# Patient Record
Sex: Female | Born: 1975 | Race: White | Hispanic: No | Marital: Married | Smoking: Never smoker
Health system: Southern US, Community
[De-identification: ages and names within clinical notes are randomized; demographics above are authoritative.]

## PROBLEM LIST (undated history)

## (undated) DIAGNOSIS — K8020 Calculus of gallbladder without cholecystitis without obstruction: Secondary | ICD-10-CM

## (undated) DIAGNOSIS — M1990 Unspecified osteoarthritis, unspecified site: Secondary | ICD-10-CM

## (undated) DIAGNOSIS — T7840XA Allergy, unspecified, initial encounter: Secondary | ICD-10-CM

## (undated) HISTORY — DX: Unspecified osteoarthritis, unspecified site: M19.90

## (undated) HISTORY — DX: Calculus of gallbladder without cholecystitis without obstruction: K80.20

## (undated) HISTORY — DX: Allergy, unspecified, initial encounter: T78.40XA

---

## 2011-12-28 ENCOUNTER — Ambulatory Visit: Payer: Self-pay | Admitting: Obstetrics and Gynecology

## 2011-12-28 LAB — CBC WITH DIFFERENTIAL/PLATELET
Basophil #: 0 10*3/uL (ref 0.0–0.1)
Eosinophil #: 0.1 10*3/uL (ref 0.0–0.7)
Eosinophil %: 0.5 %
HCT: 33.8 % — ABNORMAL LOW (ref 35.0–47.0)
Lymphocyte %: 17.4 %
MCHC: 34 g/dL (ref 32.0–36.0)
Monocyte %: 3.2 %
Platelet: 182 10*3/uL (ref 150–440)
RBC: 4.1 10*6/uL (ref 3.80–5.20)
RDW: 15.3 % — ABNORMAL HIGH (ref 11.5–14.5)
WBC: 11.7 10*3/uL — ABNORMAL HIGH (ref 3.6–11.0)

## 2011-12-29 ENCOUNTER — Inpatient Hospital Stay: Payer: Self-pay | Admitting: Obstetrics & Gynecology

## 2011-12-30 LAB — HEMATOCRIT: HCT: 28.4 % — ABNORMAL LOW (ref 35.0–47.0)

## 2012-03-30 LAB — HM PAP SMEAR: HM Pap smear: NORMAL

## 2012-05-09 DIAGNOSIS — K8020 Calculus of gallbladder without cholecystitis without obstruction: Secondary | ICD-10-CM

## 2012-05-09 HISTORY — DX: Calculus of gallbladder without cholecystitis without obstruction: K80.20

## 2012-06-01 ENCOUNTER — Observation Stay: Payer: Self-pay | Admitting: Surgery

## 2012-06-01 LAB — URINALYSIS, COMPLETE
Bilirubin,UR: NEGATIVE
Blood: NEGATIVE
Glucose,UR: NEGATIVE mg/dL (ref 0–75)
Leukocyte Esterase: NEGATIVE
Nitrite: NEGATIVE
Ph: 6 (ref 4.5–8.0)
RBC,UR: <1 /HPF (ref 0–5)
Specific Gravity: 1.011 (ref 1.003–1.030)
Squamous Epithelial: <1
WBC UR: 1 /HPF (ref 0–5)

## 2012-06-01 LAB — COMPREHENSIVE METABOLIC PANEL
Alkaline Phosphatase: 171 U/L — ABNORMAL HIGH (ref 50–136)
Chloride: 106 mmol/L (ref 98–107)
Co2: 27 mmol/L (ref 21–32)
Creatinine: 0.52 mg/dL — ABNORMAL LOW (ref 0.60–1.30)
EGFR (African American): >60
Glucose: 85 mg/dL (ref 65–99)
Osmolality: 278 (ref 275–301)
SGOT(AST): 34 U/L (ref 15–37)
SGPT (ALT): 48 U/L (ref 12–78)

## 2012-06-01 LAB — CBC
HCT: 40.1 % (ref 35.0–47.0)
MCH: 26.2 pg (ref 26.0–34.0)
MCHC: 32.9 g/dL (ref 32.0–36.0)
MCV: 80 fL (ref 80–100)
Platelet: 214 10*3/uL (ref 150–440)
RBC: 5.02 10*6/uL (ref 3.80–5.20)

## 2012-06-01 LAB — PREGNANCY, URINE: Pregnancy Test, Urine: NEGATIVE m[IU]/mL

## 2012-06-02 LAB — CBC WITH DIFFERENTIAL/PLATELET
Basophil #: 0.1 10*3/uL (ref 0.0–0.1)
Eosinophil %: 0.6 %
HCT: 34.8 % — ABNORMAL LOW (ref 35.0–47.0)
HGB: 11.7 g/dL — ABNORMAL LOW (ref 12.0–16.0)
Lymphocyte #: 2.6 10*3/uL (ref 1.0–3.6)
Lymphocyte %: 29.5 %
MCHC: 33.5 g/dL (ref 32.0–36.0)
MCV: 79 fL — ABNORMAL LOW (ref 80–100)
Neutrophil #: 5.5 10*3/uL (ref 1.4–6.5)
Neutrophil %: 62.4 %
WBC: 8.9 10*3/uL (ref 3.6–11.0)

## 2012-08-16 ENCOUNTER — Ambulatory Visit: Payer: Self-pay | Admitting: Gastroenterology

## 2012-08-28 LAB — HM COLONOSCOPY: HM Colonoscopy: NEGATIVE

## 2012-09-05 ENCOUNTER — Ambulatory Visit: Payer: Self-pay | Admitting: Gastroenterology

## 2012-09-07 LAB — PATHOLOGY REPORT

## 2012-09-13 ENCOUNTER — Other Ambulatory Visit: Payer: Self-pay | Admitting: Family Medicine

## 2012-09-13 LAB — IRON AND TIBC
Iron Bind.Cap.(Total): 385 ug/dL (ref 250–450)
Iron Saturation: 8 %
Iron: 31 ug/dL — ABNORMAL LOW (ref 50–170)
Unbound Iron-Bind.Cap.: 354 ug/dL

## 2012-09-13 LAB — FERRITIN: Ferritin (ARMC): 16 ng/mL

## 2012-09-13 LAB — LIPASE, BLOOD: Lipase: 127 U/L (ref 73–393)

## 2012-10-10 ENCOUNTER — Ambulatory Visit: Payer: Self-pay | Admitting: Gastroenterology

## 2012-10-11 LAB — PATHOLOGY REPORT

## 2012-11-06 ENCOUNTER — Encounter: Payer: Self-pay | Admitting: General Surgery

## 2012-11-29 ENCOUNTER — Ambulatory Visit (INDEPENDENT_AMBULATORY_CARE_PROVIDER_SITE_OTHER): Payer: BC Managed Care – PPO | Admitting: General Surgery

## 2012-11-29 ENCOUNTER — Encounter: Payer: Self-pay | Admitting: General Surgery

## 2012-11-29 VITALS — BP 134/82 | HR 78 | Resp 12 | Ht 64.0 in | Wt 169.0 lb

## 2012-11-29 DIAGNOSIS — K8020 Calculus of gallbladder without cholecystitis without obstruction: Secondary | ICD-10-CM | POA: Insufficient documentation

## 2012-11-29 NOTE — Patient Instructions (Addendum)

## 2012-11-29 NOTE — Progress Notes (Signed)
Patient ID: Stacy Alexander, female   DOB: 1975-12-12, 37 y.o.   MRN: [id number]  Chief Complaint  Patient presents with  . Abdominal Pain    evaluation of gallbladder    HPI Stacy Alexander is a 37 y.o. female who presents for an evaluation of her gallbladder. She states she has abdominal pain associated with gallstones. Her first attack was 4 years ago after having her son and hasn't had any more problems until January 2014. She states she has had problems off and on since then. She complains of loose stools as well as constipation but denies any nausea or vomiting. She has had recent lab work that was abnormal and an abdominal ultrasound done in April 2014. January 2014 she was hospitalized for severe abdominal pain. She has had started a gluten free diet within the last 2-3 months. This has had some relief but is still experiencing small attacks.   The patient came in with a longest his questions generated from her initial consultation with [name], M.D. As well as an extensive literature review of the Internet.  At least half the visit was spent reviewing indications for cholecystectomy, options for stone dissolution, possibility that cholecystectomy could worsen her change in bowel habits.   The patient reports until January of this year she had a very sizable, formed stool which gave her great pride on a daily basis. Since her January 2014 hospitalization her stools and more irregular. She has undergone evaluation with the GI service.   The patient seems to think she feels better on a gluten-free diet. This is certainly not confirmatory evidence that she suffers from celiac disease. HPI  Past Medical History  Diagnosis Date  . Gallstones 2014    Past Surgical History  Procedure Laterality Date  . Cesarean section  2010,2013    Family History  Problem Relation Age of Onset  . Cancer Maternal Uncle     colon    Social History History  Substance Use Topics  . Smoking  status: Never Smoker   . Smokeless tobacco: Never Used  . Alcohol Use: Yes    No Known Allergies  Current Outpatient Prescriptions  Medication Sig Dispense Refill  . b complex vitamins tablet Take 1 tablet by mouth daily.      . Iron-Vit C-Vit B12-Folic Acid (IRON 100 PLUS PO) Take by mouth.      . Melaton-Thean-Cham-PassF-LBalm (MELATONIN + L-THEANINE PO) Take by mouth.      . Vitamin D, Ergocalciferol, (DRISDOL) 50000 UNITS CAPS Take 50,000 Units by mouth.       No current facility-administered medications for this visit.    Review of Systems Review of Systems  Constitutional: Negative.   Respiratory: Negative.   Cardiovascular: Negative.   Gastrointestinal: Positive for abdominal pain, diarrhea and constipation.    Blood pressure 134/82, pulse 78, resp. rate 12, height 5\' 4"  (1.626 m), weight 169 lb (76.658 kg), last menstrual period 11/12/2012.  Physical Exam Physical Exam  Constitutional: She is oriented to person, place, and time. She appears well-developed and well-nourished.  Neck: No thyromegaly present.  Cardiovascular: Normal rate, regular rhythm and normal heart sounds.   No murmur heard. Pulmonary/Chest: Effort normal and breath sounds normal.  Abdominal: Soft. Normal appearance and bowel sounds are normal. There is no hepatosplenomegaly. There is no tenderness.  Lymphadenopathy:    She has no cervical adenopathy.  Neurological: She is alert and oriented to person, place, and time.  Skin: Skin is warm and dry.  Data Reviewed Records of her evaluation with Dr. Katrinka Blazing, Owens Shark, NP, colonoscopy and upper endoscopy reports were reviewed.  Ultrasound examination dated August 16, 2012 showed gallstones.  Colon biopsies completed September 05, 2012 showed colonic mucosa with focal lymphoid aggregates. No evidence of microscopic colitis. Small intestine biopsy completed at the time of her October 10, 2012 upper endoscopy was negative for super room, intraepithelial  leukocytosis or dysplasia. Normal villous architecture.Screening testing for celiac disease was negative.  Pancreatic function was normal. Elevated B12 level. No serologic markers for celiac disease. CBC encumbrance of metabolic panel dated August 10, 2012 was notable for a minimally depressed MCV at 79. Trace elevation of the alkaline phosphatase at 119.  Assessment    Chronic cholecystitis and cholelithiasis. No biochemical or pathologic evidence of celiac disease     Plan    The patient is very committed to the possibility of cholecystectomy in spite of a strong family history and episodic attacks that at times have been quite impressive. She notified the office of how we can be of assistance.     Patient to call the office when ready to arrange gallbladder surgery.   [name] 11/30/2012, 1:46 PM

## 2012-11-30 ENCOUNTER — Encounter: Payer: Self-pay | Admitting: General Surgery

## 2013-04-14 IMAGING — CT CT ABD-PELV W/ CM
1 of 2 series · 15 of 32 positions shown, 19 images · non-contrast
Comparison: none

REASON FOR EXAM: (1) persistent abd pain; (2) same.
COMMENTS:

[Series 2: 3mm soft tissue · axial · 0.74mm/px · z∈[-416,-24]mm · 15 of 143 slices shown, 19 images]
[im 6/143  soft-tissue]
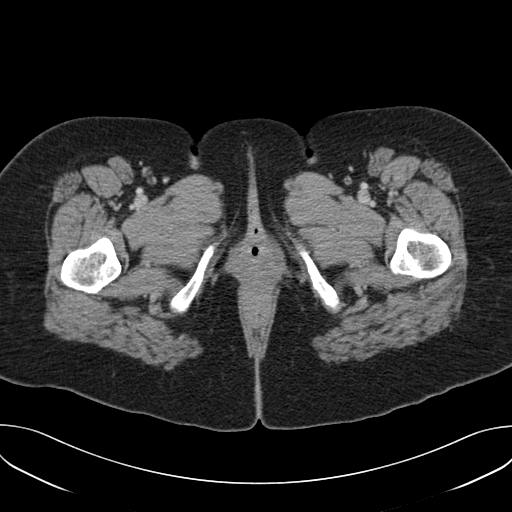
[im 6/143  bone]
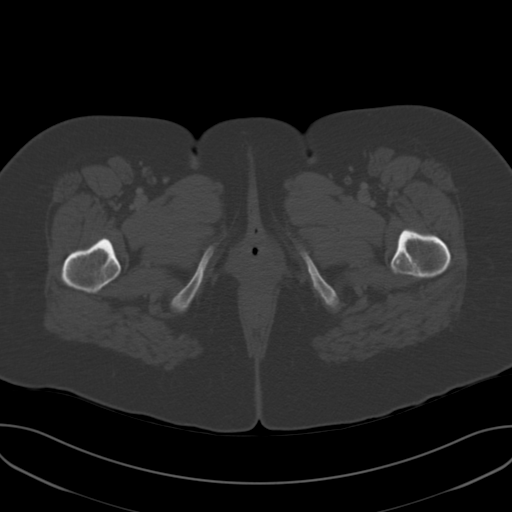
[im 18/143  soft-tissue]
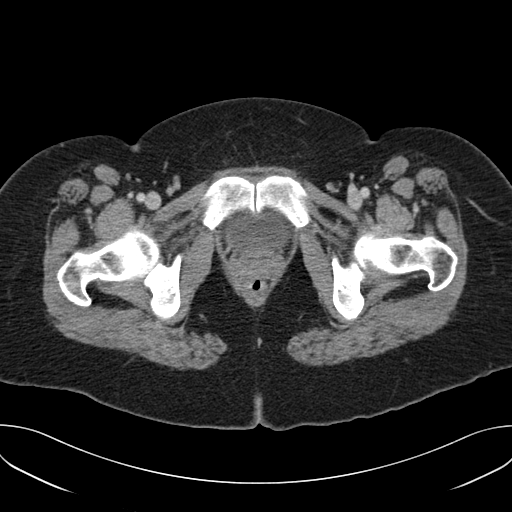
[im 29/143  soft-tissue]
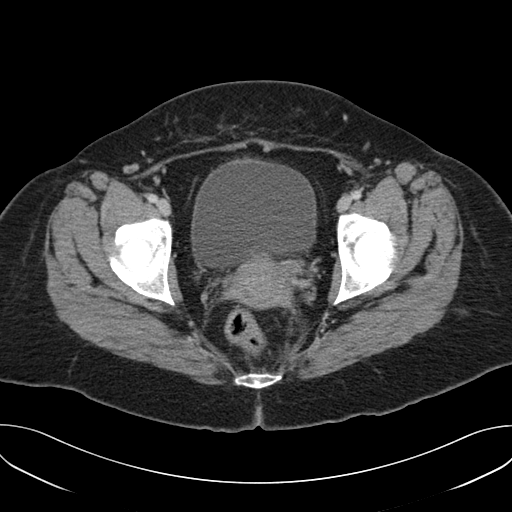
[im 40/143  soft-tissue]
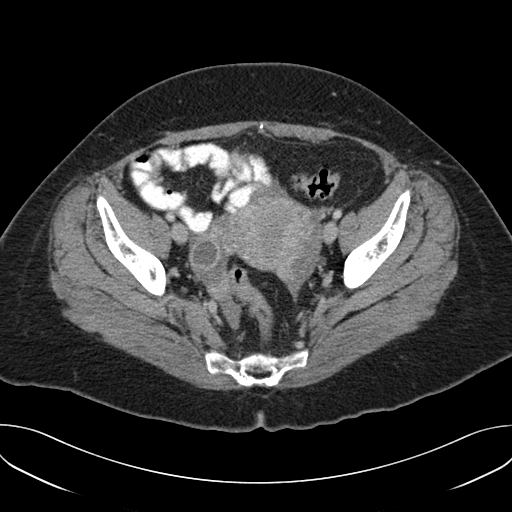
[im 52/143  soft-tissue]
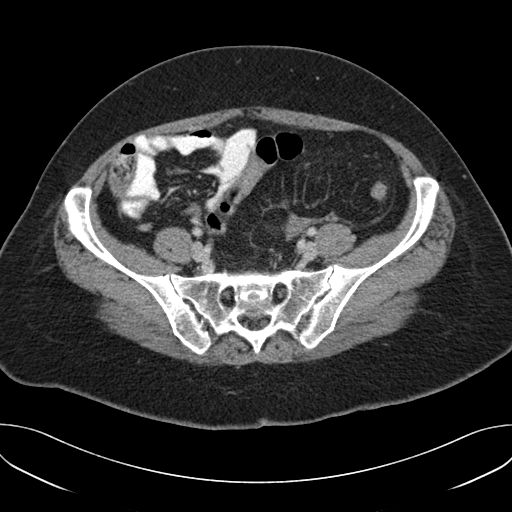
[im 63/143  soft-tissue]
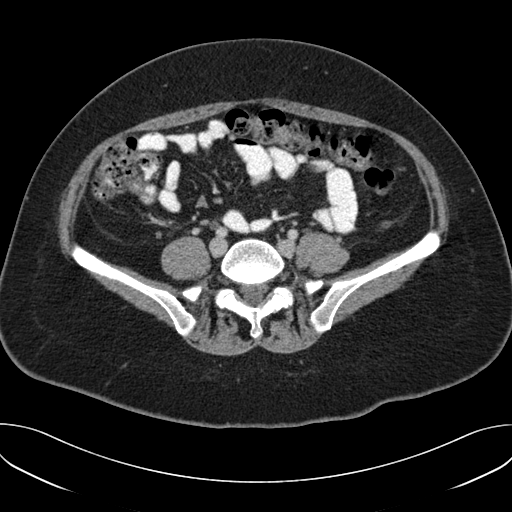
[im 74/143  soft-tissue]
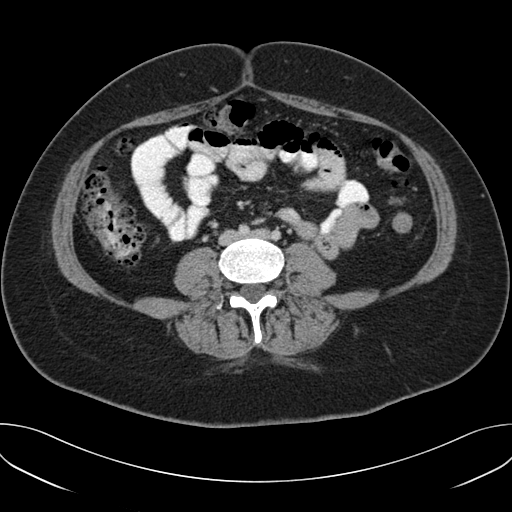
[im 80/143  soft-tissue]
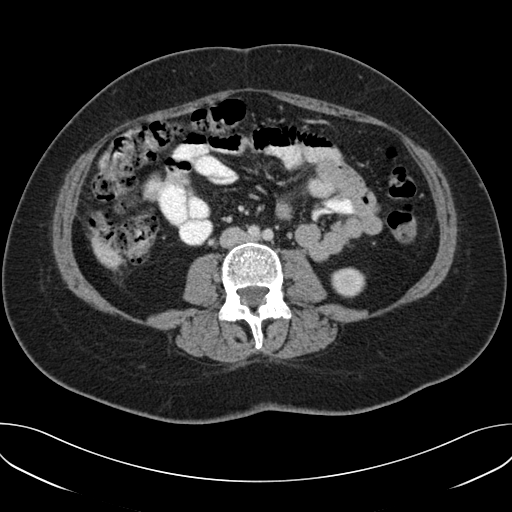
[im 91/143  soft-tissue]
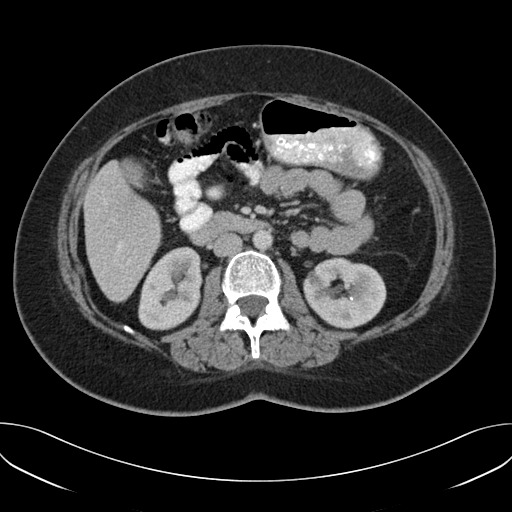
[im 91/143  bone]
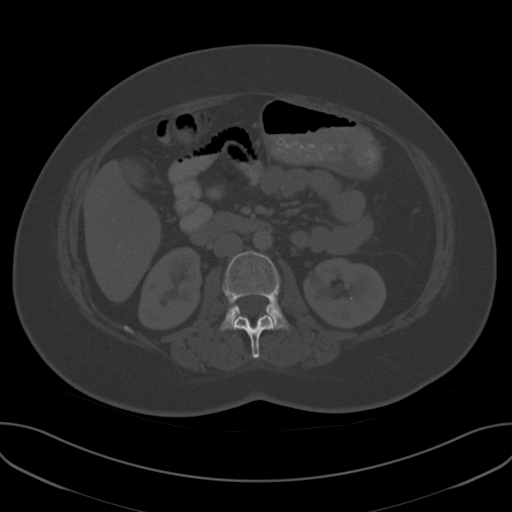
[im 103/143  soft-tissue]
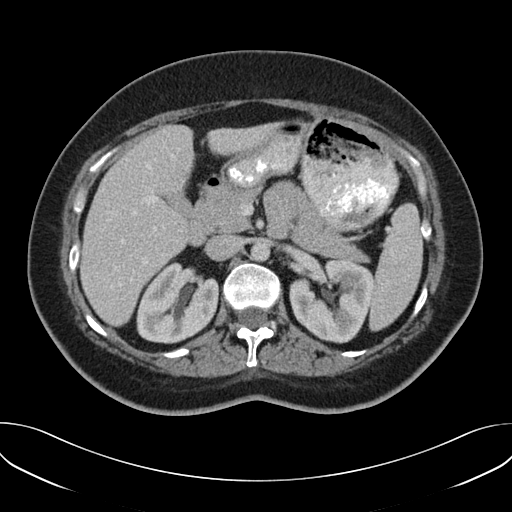
[im 114/143  soft-tissue]
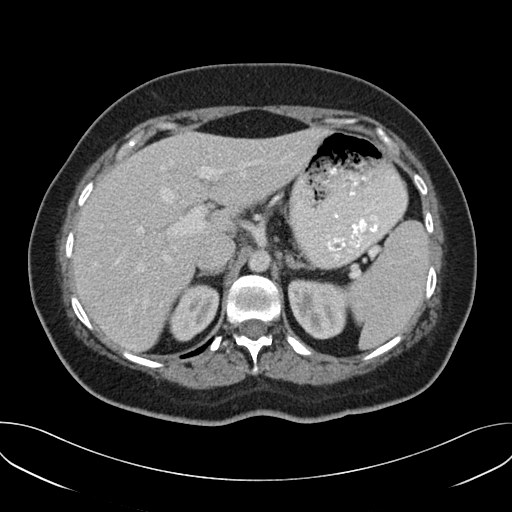
[im 120/143  lung]
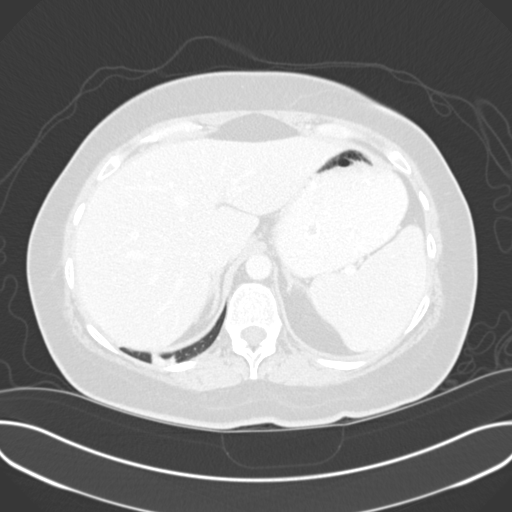
[im 125/143  soft-tissue]
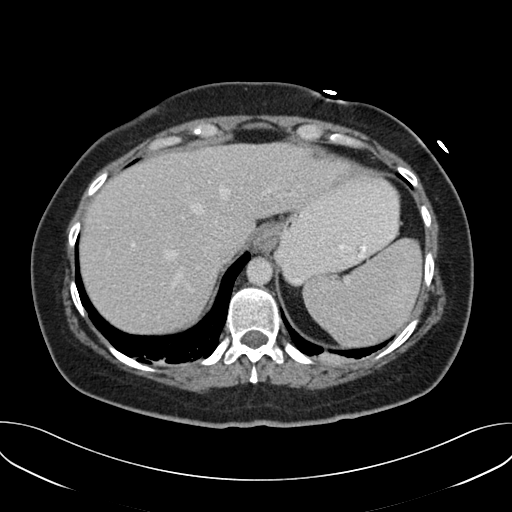
[im 125/143  lung]
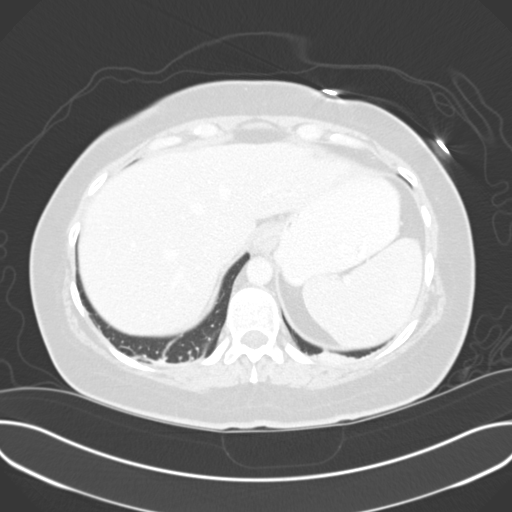
[im 131/143  lung]
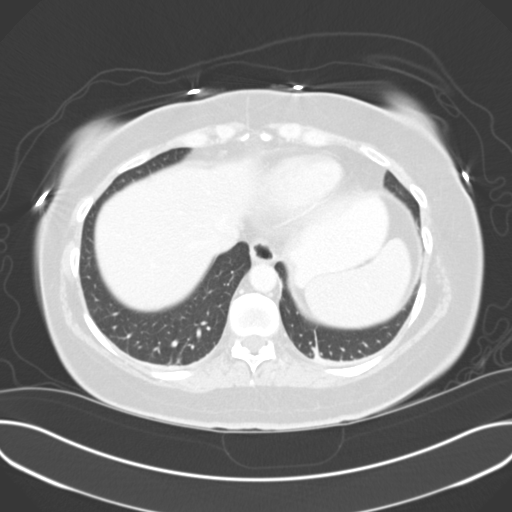
[im 137/143  soft-tissue]
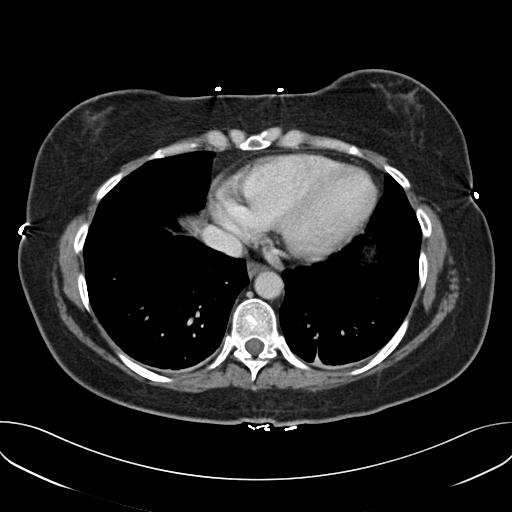
[im 137/143  lung]
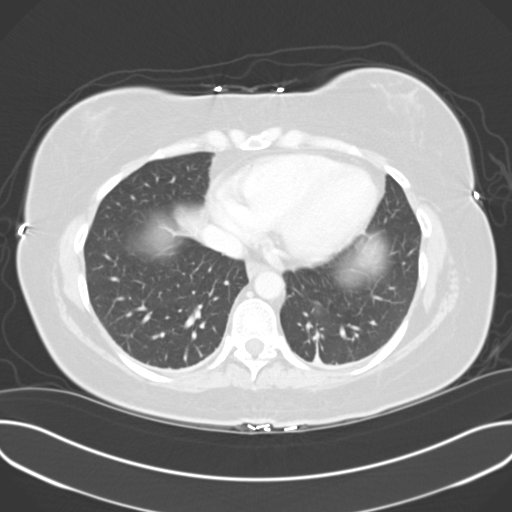

[15 of 32 positions shown; findings below may reference images not displayed]

PROCEDURE:     CT  - CT ABDOMEN / PELVIS  W  - June 02, 2012 [DATE]

RESULT:     CT of the abdomen and pelvis is performed utilizing 100 mL of
Isovue 300 iodinated intravenous contrast with images reconstructed at 3 mm
slice thickness compared to previous exam dated 01 June, 2012.

A there is left nephrolithiasis present. There is a cyst in the right ovary
with peripheral enhancement measuring approximately 2.24 cm diameter similar
to that seen previously. There is free fluid in the pelvis. The urinary
bladder is unremarkable. The uterus appears to be unremarkable. No left
adnexal abnormality is seen. The appendix is seen arising from the cecum in
the area of image 87 along the posterior surface than coursing medially with
the tip appearing to be in the region of image 92. The tip is not well
defined. The margins of the base are not well defined on image 87. There is
some minimal stranding there does not appear to be flagrant appendicitis.
Mild inflammation would be difficult to completely exclude. There is no free
air.

The liver, spleen, adrenal glands, pancreas and gallbladder appear
unremarkable. Neither kidney shows obstruction. There is no hydroureter. The
bony structures appear unremarkable.
IMPRESSION: 1. Cyst or follicle in the right ovary. Small amount of free fluid in the
pelvis which appears essentially unchanged. Left nephrolithiasis is present.
2. The possibility of some very minimal periappendiceal stranding is not
excluded. Correlate clinically. The minimal changes the tip of the appendix
and near the base could be secondary to reaction from another process given
the changes in the pelvis.

[REDACTED]

## 2013-06-28 IMAGING — US ABDOMEN ULTRASOUND
1 series · 14 of 25 positions shown · non-contrast
Comparison: none

REASON FOR EXAM: Abd pain RUQ LLQ nausea vomiting
COMMENTS:

PROCEDURE:     US  - US ABDOMEN GENERAL SURVEY  - August 16, 2012  [DATE]
RESULT:     History: Pain.
Comparison study: CT abdomen 06/02/2012.

[Series 1: abdomen ultrasound · 0.26mm/px · 14 of 96 slices shown]
[im 1/96]
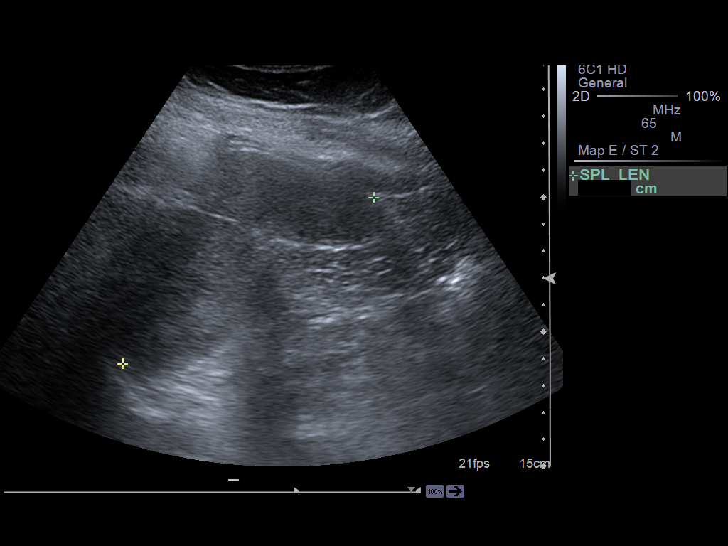
[im 8/96]
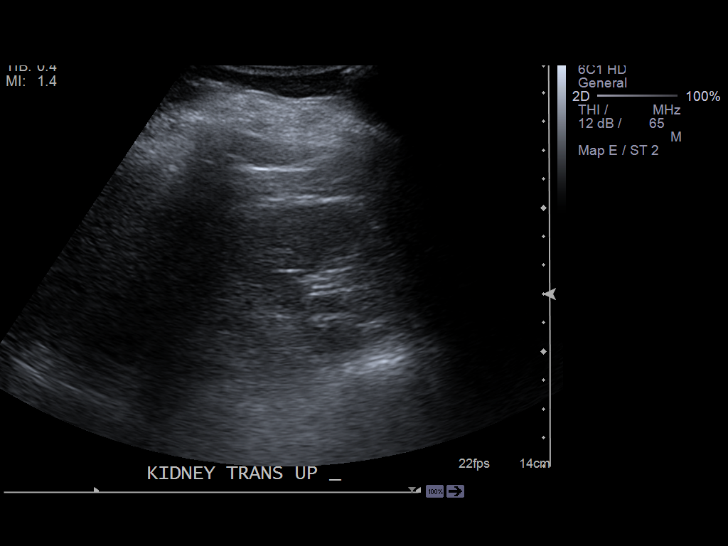
[im 16/96]
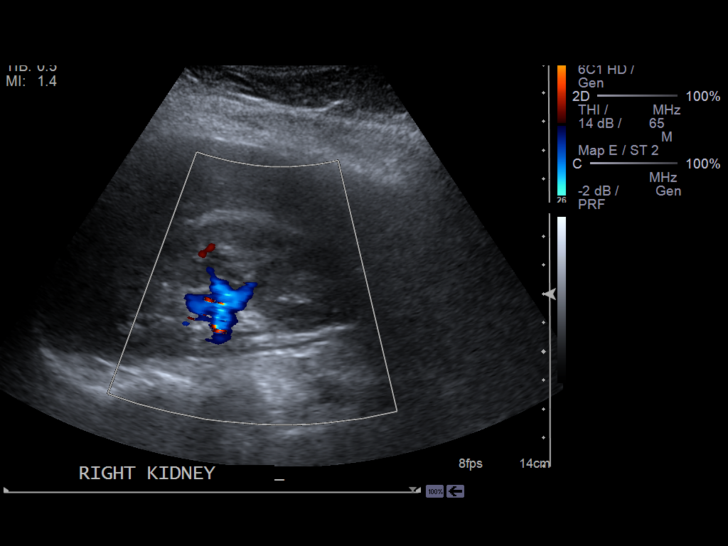
[im 24/96]
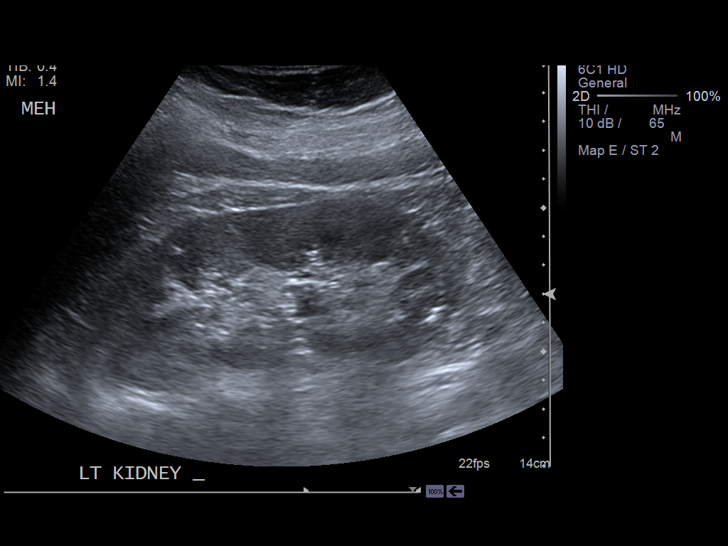
[im 32/96]
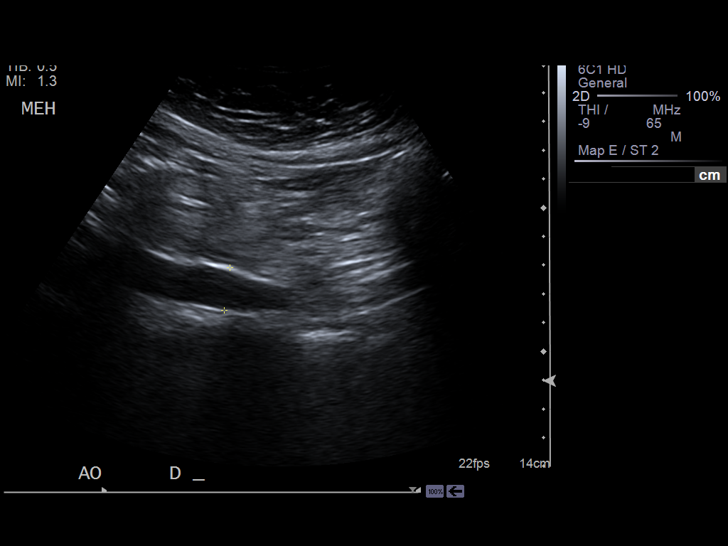
[im 36/96]
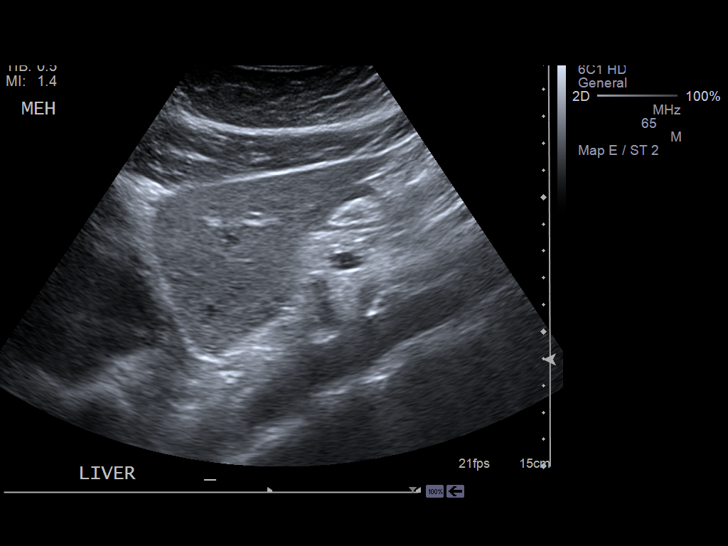
[im 44/96]
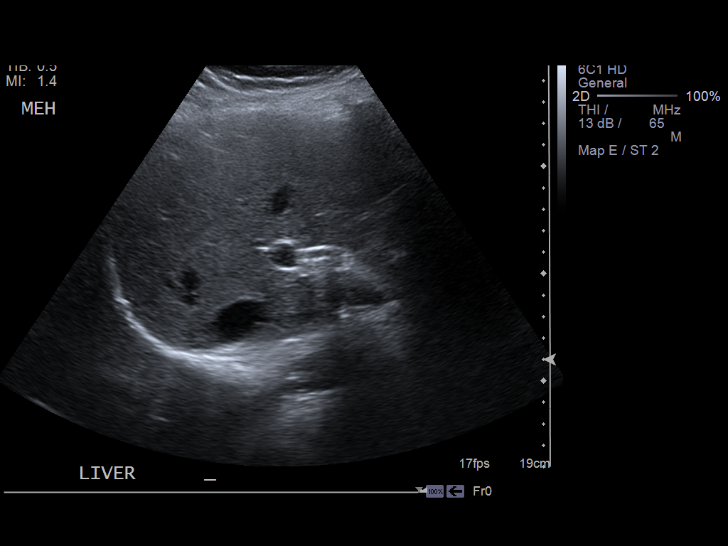
[im 52/96]
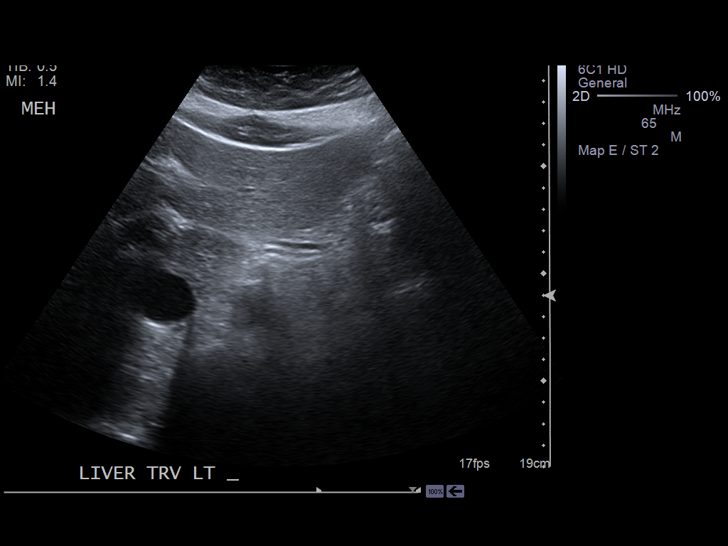
[im 60/96]
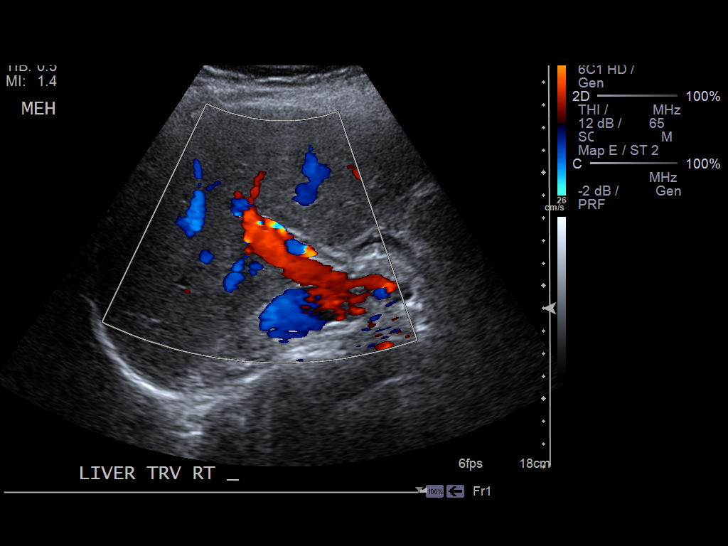
[im 64/96]
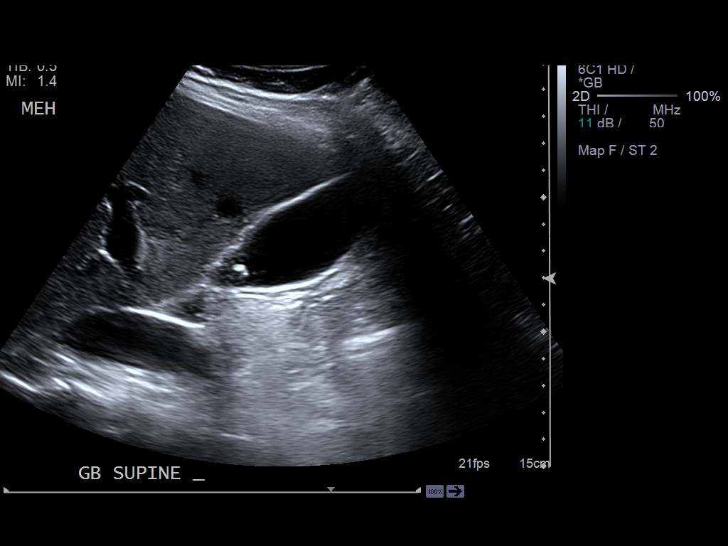
[im 72/96]
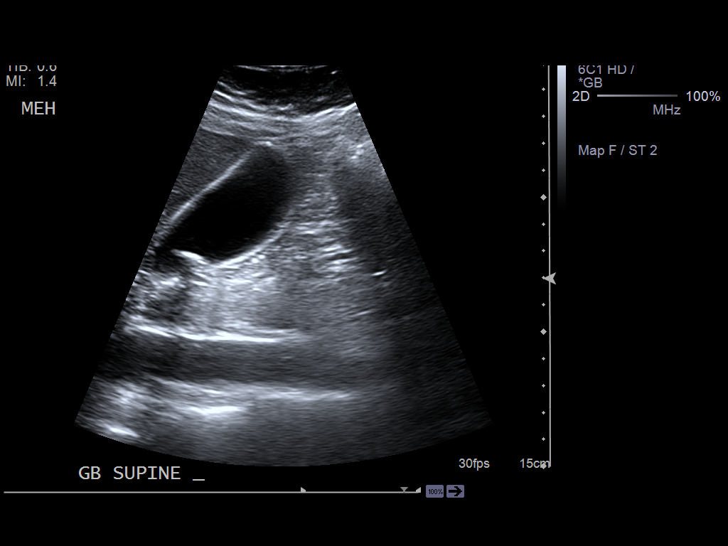
[im 80/96]
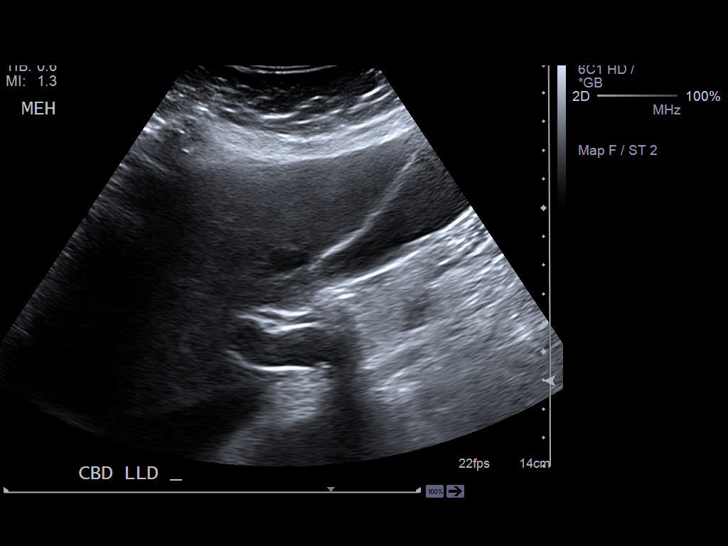
[im 88/96]
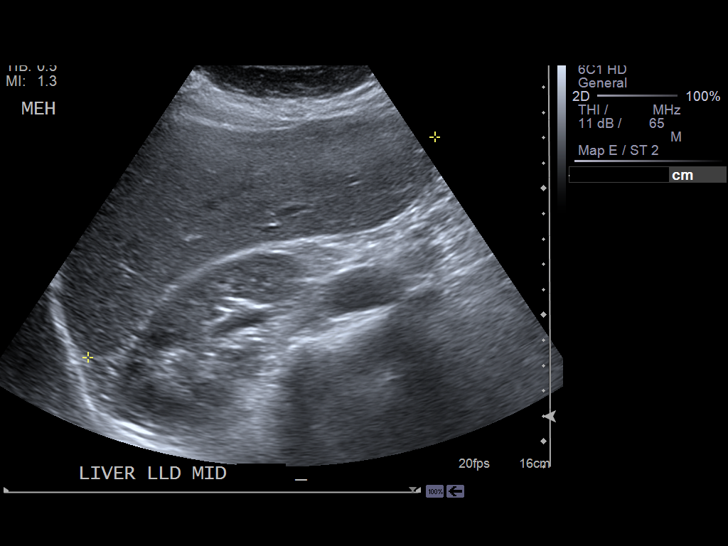
[im 96/96]
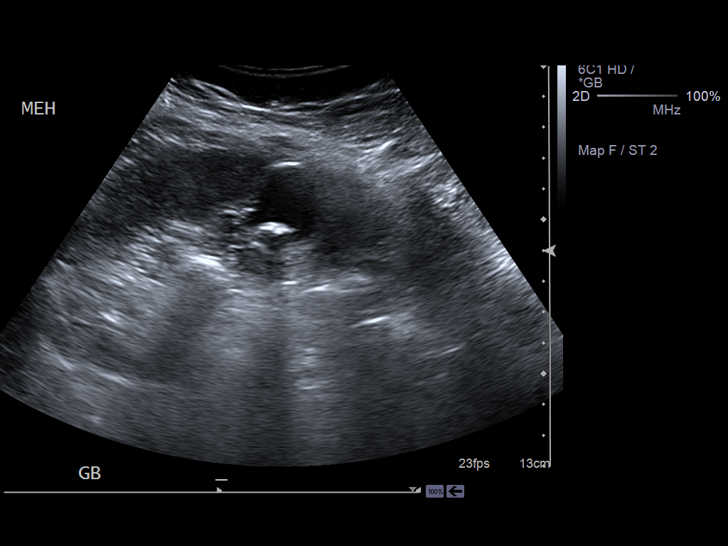

[14 of 25 positions shown; findings below may reference images not displayed]

FINDINGS: Liver normal. Portal vein patent. Pancreas normal. Gallstones.
Gall bladder wall thickness 1.8 mm. Common bile duct caliber 3.2 mm.
Abdominal aorta normal caliber. No hydronephrosis.
IMPRESSION: Gallstones.

## 2014-03-10 ENCOUNTER — Encounter: Payer: Self-pay | Admitting: General Surgery

## 2014-03-28 ENCOUNTER — Encounter: Payer: Self-pay | Admitting: Internal Medicine

## 2014-03-28 ENCOUNTER — Ambulatory Visit (INDEPENDENT_AMBULATORY_CARE_PROVIDER_SITE_OTHER): Payer: BC Managed Care – PPO | Admitting: Internal Medicine

## 2014-03-28 VITALS — BP 120/78 | HR 81 | Temp 98.0°F | Resp 16 | Ht 64.5 in | Wt 155.5 lb

## 2014-03-28 DIAGNOSIS — R1084 Generalized abdominal pain: Secondary | ICD-10-CM

## 2014-03-28 DIAGNOSIS — R197 Diarrhea, unspecified: Secondary | ICD-10-CM | POA: Insufficient documentation

## 2014-03-28 DIAGNOSIS — N924 Excessive bleeding in the premenopausal period: Secondary | ICD-10-CM

## 2014-03-28 DIAGNOSIS — E785 Hyperlipidemia, unspecified: Secondary | ICD-10-CM

## 2014-03-28 DIAGNOSIS — F4323 Adjustment disorder with mixed anxiety and depressed mood: Secondary | ICD-10-CM | POA: Insufficient documentation

## 2014-03-28 DIAGNOSIS — G629 Polyneuropathy, unspecified: Secondary | ICD-10-CM | POA: Insufficient documentation

## 2014-03-28 DIAGNOSIS — R002 Palpitations: Secondary | ICD-10-CM

## 2014-03-28 LAB — COMPREHENSIVE METABOLIC PANEL
ALK PHOS: 88 U/L (ref 39–117)
ALT: 27 U/L (ref 0–35)
AST: 32 U/L (ref 0–37)
Albumin: 4.4 g/dL (ref 3.5–5.2)
BUN: 11 mg/dL (ref 6–23)
CO2: 26 mEq/L (ref 19–32)
CREATININE: 0.5 mg/dL (ref 0.4–1.2)
Calcium: 9.2 mg/dL (ref 8.4–10.5)
Chloride: 106 mEq/L (ref 96–112)
GFR: 135.48 mL/min (ref >60.00)
Glucose, Bld: 82 mg/dL (ref 70–99)
Potassium: 3.7 mEq/L (ref 3.5–5.1)
Sodium: 139 mEq/L (ref 135–145)
Total Bilirubin: 0.7 mg/dL (ref 0.2–1.2)
Total Protein: 7.2 g/dL (ref 6.0–8.3)

## 2014-03-28 LAB — TSH: TSH: 0.99 u[IU]/mL (ref 0.35–4.50)

## 2014-03-28 LAB — MAGNESIUM: Magnesium: 1.8 mg/dL (ref 1.5–2.5)

## 2014-03-28 LAB — CBC WITH DIFFERENTIAL/PLATELET
BASOS ABS: 0.1 10*3/uL (ref 0.0–0.1)
Basophils Relative: 0.8 % (ref 0.0–3.0)
EOS ABS: 0.1 10*3/uL (ref 0.0–0.7)
Eosinophils Relative: 1.6 % (ref 0.0–5.0)
HCT: 41 % (ref 36.0–46.0)
HEMOGLOBIN: 13.7 g/dL (ref 12.0–15.0)
LYMPHS PCT: 27.3 % (ref 12.0–46.0)
Lymphs Abs: 2.1 10*3/uL (ref 0.7–4.0)
MCHC: 33.4 g/dL (ref 30.0–36.0)
MCV: 82.1 fl (ref 78.0–100.0)
MONOS PCT: 6.3 % (ref 3.0–12.0)
Monocytes Absolute: 0.5 10*3/uL (ref 0.1–1.0)
NEUTROS PCT: 64 % (ref 43.0–77.0)
Neutro Abs: 4.9 10*3/uL (ref 1.4–7.7)
PLATELETS: 156 10*3/uL (ref 150.0–400.0)
RBC: 4.99 Mil/uL (ref 3.87–5.11)
RDW: 14.2 % (ref 11.5–15.5)
WBC: 7.7 10*3/uL (ref 4.0–10.5)

## 2014-03-28 LAB — LIPID PANEL
CHOLESTEROL: 154 mg/dL (ref 0–200)
HDL: 51 mg/dL (ref >39.00)
LDL CALC: 95 mg/dL (ref 0–99)
NonHDL: 103
TRIGLYCERIDES: 38 mg/dL (ref 0.0–149.0)
Total CHOL/HDL Ratio: 3
VLDL: 7.6 mg/dL (ref 0.0–40.0)

## 2014-03-28 LAB — VITAMIN B12: VITAMIN B 12: 678 pg/mL (ref 211–911)

## 2014-03-28 MED ORDER — SERTRALINE HCL 50 MG PO TABS: 50.0000 mg | ORAL_TABLET | Freq: Every day | ORAL | Status: DC

## 2014-03-28 NOTE — Progress Notes (Signed)
Patient ID: Stacy MormonFrances R Alexander, female   DOB: 12/01/1975, 38 y.o.   MRN: [id number]  Patient Active Problem List   Diagnosis Date Noted  . Abdominal pain 03/30/2014  . Menorrhagia, premenopausal 03/30/2014  . Adjustment disorder with mixed anxiety and depressed mood 03/28/2014  . Neuropathy 03/28/2014  . Diarrhea 03/28/2014  . Palpitations 03/28/2014  . Gallstones     Subjective:  CC:   Chief Complaint  Patient presents with  . Establish Care    HPI:   Stacy PomfretFrances R Frauneis a 38 y.o. female who presents as a referral  by mother Stacy Alexander.  Patient has a long, list of concerns today .    HM:  requesting  PAP smear  Last one was 2 years ago  No history of abnormals.    Heavy muesds, not tampons,  About 4 or 5 daily.  The first 2 days are heavy,  Told she was borderline anemic,  Tried taking iron supplements last month , stopped because the OTC formulations made the  veins in her legs bulge.  Has  History of rvaricose veins.   Has not had hgb or iron checked in 1 year and tolerated a prescribed iron for 3 months previously without leg symptoms .   History of gallstones found a year ago during workup for change in stool BM to loose stools  .chole advised but deferred.  Had a colonoscopy and EGD were normal. Stacy Potters([facility]) celiac test negative. Has lost 30 lbs avoiding gluten.     History of several broken bones as a child: leg fracture at 18 months, both arms fractured as a child.  elementary school. mishaps`   Now getting bilateral upper quadrant pain .  Stabbing quality not sure if brought on by eating.  Occurs once daily or every other day, lasts about 2 minutes . Does not occur when exercising , does not occur when lying down,  Very random   Bowel movements are  regular,  Has a normal BM in the morming,  Then has a liquid stool  If she has a smoothie made from protein powder (made from hemp seed, pea protein, and banana, blueberries and kale /chard/spinach with keffir made from [place])  .  For past 1.5 years. The onset of her GI  symptoms appear to cooncide with use of smoothies  Exercise: treadmill. Upper body weight s,  abd machine.  Anxiety: 2 young boys,  Stay at home MOm.  Likes to avoid medications .  Gets heart palpitations,  Feels both shoulders get heavy ,  Impacting daily life,  Feels she is is not handling her tasks well and not enjoying children and husband.  Started when youngest son was born and became apneic and nd hypothermic and required  a 7 day stay in then NICU.  This son has subsequently been diagnosed with developmental delay,  Lots of therapy going on .  Patient denies mania , suicidality,  No obsessive compulsive behaviors or thoughts but does report compulsive neatness  As a perfecionies't  Using wine to relax but getting diminishing returns.  Only desires wine during the  week   zoloft recommended  3) weird feeling in toes,  Shooting feeling  .  Occurs once daily ,  Lasts < 5 sec s  4) callous pf right lateral MT.  History of self diagnosed plantar fasciitis    Past Medical History  Diagnosis Date  . Gallstones 2014  . Arthritis   . Allergy     No  Known Allergies    Past Surgical History  Procedure Laterality Date  . Cesarean section  2010,2013    History   Social History  . Marital Status: Married    Spouse Name: N/A    Number of Children: N/A  . Years of Education: N/A   Occupational History  . Not on file.   Social History Main Topics  . Smoking status: Never Smoker   . Smokeless tobacco: Never Used  . Alcohol Use: Yes  . Drug Use: No  . Sexual Activity: Not on file   Other Topics Concern  . Not on file   Social History Narrative   Family History  Problem Relation Age of Onset  . Cancer Maternal Uncle     colon  . Hypertension Mother   . Hyperlipidemia Mother   . Hypertension Father   . Hyperlipidemia Father       Review of Systems:   The rest of the review of systems was negative except those addressed  in the HPI.      Objective:  BP 120/78 mmHg  Pulse 81  Temp(Src) 98 F (36.7 C) (Oral)  Resp 16  Ht 5' 4.5" (1.638 m)  Wt 155 lb 8 oz (70.534 kg)  BMI 26.29 kg/m2  SpO2 99%  LMP 02/28/2014 (Approximate)  General appearance: alert, cooperative and appears stated age Ears: normal TM's and external ear canals both ears Throat: lips, mucosa, and tongue normal; teeth and gums normal Neck: no adenopathy, no carotid bruit, supple, symmetrical, trachea midline and thyroid not enlarged, symmetric, no tenderness/mass/nodules Back: symmetric, no curvature. ROM normal. No CVA tenderness. Lungs: clear to auscultation bilaterally Heart: regular rate and rhythm, S1, S2 normal, no murmur, click, rub or gallop Abdomen: soft, non-tender; bowel sounds normal; no masses,  no organomegaly Pulses: 2+ and symmetric Skin: Skin color, texture, turgor normal. No rashes or lesions Lymph nodes: Cervical, supraclavicular, and axillary nodes normal.  Assessment and Plan:  Adjustment disorder with mixed anxiety and depressed mood zoloft trial recommended  Prn alprazolam   Neuropathy Suggested by history of shooting pains in feet.  Workup in proess for B12 , thyroid deficiency   Palpitations Exam is norma cbc,  B12,thyroid all normal..  Reassurance given.   Lab Results  Component Value Date   WBC 7.7 03/28/2014   HGB 13.7 03/28/2014   HCT 41.0 03/28/2014   MCV 82.1 03/28/2014   PLT 156.0 03/28/2014   Lab Results  Component Value Date   TSH 0.99 03/28/2014   Lab Results  Component Value Date   NA 139 03/28/2014   K 3.7 03/28/2014   CL 106 03/28/2014   CO2 26 03/28/2014     Diarrhea Appears to be food related, since it coincides with use of smoothies daily.  Advised to supsend smoothies for a week to see if episodes resolve.  If not, trial of bentyl for irritable bowel.   Abdominal pain Her recurrent bilateral upper abd pain is suggestive of IBS given her prior negative workup  .   Will consider trial of bentyl   Menorrhagia, premenopausal Difficult to quantify patient's volume of menses,  She is not anemic,  Thyroid function is normal. reassurance provided. Iron studies are pending   A total of 60  minutes was spent with patient more than half of which was spent in counseling patient on the above mentioned issues , reviewing and explaining recent labs and imaging studies done, and coordination of care. Updated Medication List Outpatient Encounter Prescriptions as  of 03/28/2014  Medication Sig  . b complex vitamins tablet Take 1 tablet by mouth daily.  . Iron-Vit C-Vit B12-Folic Acid (IRON 100 PLUS PO) Take by mouth.  . Melaton-Thean-Cham-PassF-LBalm (MELATONIN + L-THEANINE PO) Take by mouth.  . sertraline (ZOLOFT) 50 MG tablet Take 1 tablet (50 mg total) by mouth daily.  . Vitamin D, Ergocalciferol, (DRISDOL) 50000 UNITS CAPS Take 50,000 Units by mouth.     Orders Placed This Encounter  Procedures  . Vitamin B12  . CBC with Differential  . Folate RBC  . TSH  . Comprehensive metabolic panel  . Magnesium  . Lipid panel  . HM PAP SMEAR  . Ferritin  . Iron and TIBC    Return in about 6 weeks (around 05/09/2014).

## 2014-03-28 NOTE — Patient Instructions (Signed)
i AM STARTING YOU ON GENERIC ZOLOFT,  Start with 1/2 tablet daily with dinner,   Increase to full tablet after a few days.    Sertraline tablets What is this medicine? SERTRALINE (SER tra leen) is used to treat depression. It may also be used to treat obsessive compulsive disorder, panic disorder, post-trauma stress, premenstrual dysphoric disorder (PMDD) or social anxiety. This medicine may be used for other purposes; ask your health care provider or pharmacist if you have questions. COMMON BRAND NAME(S): Zoloft What should I tell my health care provider before I take this medicine? They need to know if you have any of these conditions: -bipolar disorder or a family history of bipolar disorder -diabetes -glaucoma -heart disease -high blood pressure -history of irregular heartbeat -history of low levels of calcium, magnesium, or potassium in the blood -if you often drink alcohol -liver disease -receiving electroconvulsive therapy -seizures -suicidal thoughts, plans, or attempt; a previous suicide attempt by you or a family member -thyroid disease -an unusual or allergic reaction to sertraline, other medicines, foods, dyes, or preservatives -pregnant or trying to get pregnant -breast-feeding How should I use this medicine? Take this medicine by mouth with a glass of water. Follow the directions on the prescription label. You can take it with or without food. Take your medicine at regular intervals. Do not take your medicine more often than directed. Do not stop taking this medicine suddenly except upon the advice of your doctor. Stopping this medicine too quickly may cause serious side effects or your condition may worsen. A special MedGuide will be given to you by the pharmacist with each prescription and refill. Be sure to read this information carefully each time. Talk to your pediatrician regarding the use of this medicine in children. While this drug may be prescribed for children  as young as 7 years for selected conditions, precautions do apply. Overdosage: If you think you have taken too much of this medicine contact a poison control center or emergency room at once. NOTE: This medicine is only for you. Do not share this medicine with others. What if I miss a dose? If you miss a dose, take it as soon as you can. If it is almost time for your next dose, take only that dose. Do not take double or extra doses. What may interact with this medicine? Do not take this medicine with any of the following medications: -certain medicines for fungal infections like fluconazole, itraconazole, ketoconazole, posaconazole, voriconazole -cisapride -disulfiram -dofetilide -linezolid -MAOIs like Carbex, Eldepryl, Marplan, Nardil, and Parnate -metronidazole -methylene blue (injected into a vein) -pimozide -thioridazine -ziprasidone This medicine may also interact with the following medications: -alcohol -aspirin and aspirin-like medicines -certain medicines for depression, anxiety, or psychotic disturbances -certain medicines for irregular heart beat like flecainide, propafenone -certain medicines for migraine headaches like almotriptan, eletriptan, frovatriptan, naratriptan, rizatriptan, sumatriptan, zolmitriptan -certain medicines for sleep -certain medicines for seizures like carbamazepine, valproic acid, phenytoin -certain medicines that treat or prevent blood clots like warfarin, enoxaparin, dalteparin -cimetidine -digoxin -diuretics -fentanyl -furazolidone -isoniazid -lithium -NSAIDs, medicines for pain and inflammation, like ibuprofen or naproxen -other medicines that prolong the QT interval (cause an abnormal heart rhythm) -procarbazine -rasagiline -supplements like St. John's wort, kava kava, valerian -tolbutamide -tramadol -tryptophan This list may not describe all possible interactions. Give your health care provider a list of all the medicines, herbs,  non-prescription drugs, or dietary supplements you use. Also tell them if you smoke, drink alcohol, or use illegal drugs. Some items  may interact with your medicine. What should I watch for while using this medicine? Tell your doctor if your symptoms do not get better or if they get worse. Visit your doctor or health care professional for regular checks on your progress. Because it may take several weeks to see the full effects of this medicine, it is important to continue your treatment as prescribed by your doctor. Patients and their families should watch out for new or worsening thoughts of suicide or depression. Also watch out for sudden changes in feelings such as feeling anxious, agitated, panicky, irritable, hostile, aggressive, impulsive, severely restless, overly excited and hyperactive, or not being able to sleep. If this happens, especially at the beginning of treatment or after a change in dose, call your health care professional. Bonita QuinYou may get drowsy or dizzy. Do not drive, use machinery, or do anything that needs mental alertness until you know how this medicine affects you. Do not stand or sit up quickly, especially if you are an older patient. This reduces the risk of dizzy or fainting spells. Alcohol may interfere with the effect of this medicine. Avoid alcoholic drinks. Your mouth may get dry. Chewing sugarless gum or sucking hard candy, and drinking plenty of water may help. Contact your doctor if the problem does not go away or is severe. What side effects may I notice from receiving this medicine? Side effects that you should report to your doctor or health care professional as soon as possible: -allergic reactions like skin rash, itching or hives, swelling of the face, lips, or tongue -black or bloody stools, blood in the urine or vomit -fast, irregular heartbeat -feeling faint or lightheaded, falls -hallucination, loss of contact with reality -seizures -suicidal thoughts or other  mood changes -unusual bleeding or bruising -unusually weak or tired -vomiting Side effects that usually do not require medical attention (report to your doctor or health care professional if they continue or are bothersome): -change in appetite -change in sex drive or performance -diarrhea -increased sweating -indigestion, nausea -tremors This list may not describe all possible side effects. Call your doctor for medical advice about side effects. You may report side effects to FDA at 1-800-FDA-1088. Where should I keep my medicine? Keep out of the reach of children. Store at room temperature between 15 and 30 degrees C (59 and 86 degrees F). Throw away any unused medicine after the expiration date. NOTE: This sheet is a summary. It may not cover all possible information. If you have questions about this medicine, talk to your doctor, pharmacist, or health care provider.  2015, Elsevier/Gold Standard. (2012-11-20 12:57:35)

## 2014-03-30 DIAGNOSIS — N924 Excessive bleeding in the premenopausal period: Secondary | ICD-10-CM | POA: Insufficient documentation

## 2014-03-30 DIAGNOSIS — R109 Unspecified abdominal pain: Secondary | ICD-10-CM | POA: Insufficient documentation

## 2014-03-30 NOTE — Assessment & Plan Note (Signed)
Difficult to quantify patient's volume of menses,  She is not anemic,  Thyroid function is normal. reassurance provided. Iron studies are pending

## 2014-03-30 NOTE — Assessment & Plan Note (Signed)
Exam is norma cbc,  B12,thyroid all normal..  Reassurance given.   Lab Results  Component Value Date   WBC 7.7 03/28/2014   HGB 13.7 03/28/2014   HCT 41.0 03/28/2014   MCV 82.1 03/28/2014   PLT 156.0 03/28/2014   Lab Results  Component Value Date   TSH 0.99 03/28/2014   Lab Results  Component Value Date   NA 139 03/28/2014   K 3.7 03/28/2014   CL 106 03/28/2014   CO2 26 03/28/2014

## 2014-03-30 NOTE — Assessment & Plan Note (Signed)
Appears to be food related, since it coincides with use of smoothies daily.  Advised to supsend smoothies for a week to see if episodes resolve.  If not, trial of bentyl for irritable bowel.

## 2014-03-30 NOTE — Assessment & Plan Note (Signed)
Suggested by history of shooting pains in feet.  Workup in proess for B12 , thyroid deficiency

## 2014-03-30 NOTE — Assessment & Plan Note (Signed)
zoloft trial recommended  Prn alprazolam

## 2014-03-30 NOTE — Assessment & Plan Note (Signed)
Her recurrent bilateral upper abd pain is suggestive of IBS given her prior negative workup  .  Will consider trial of bentyl

## 2014-04-01 ENCOUNTER — Other Ambulatory Visit (INDEPENDENT_AMBULATORY_CARE_PROVIDER_SITE_OTHER): Payer: BC Managed Care – PPO

## 2014-04-01 DIAGNOSIS — N924 Excessive bleeding in the premenopausal period: Secondary | ICD-10-CM

## 2014-04-01 LAB — FERRITIN: FERRITIN: 14.3 ng/mL (ref 10.0–291.0)

## 2014-04-02 LAB — IRON AND TIBC
%SAT: 14 % — AB (ref 20–55)
Iron: 53 ug/dL (ref 42–145)
TIBC: 387 ug/dL (ref 250–470)
UIBC: 334 ug/dL (ref 125–400)

## 2014-04-02 LAB — FOLATE RBC: RBC FOLATE: 851 ng/mL (ref >280)

## 2014-04-24 ENCOUNTER — Ambulatory Visit (INDEPENDENT_AMBULATORY_CARE_PROVIDER_SITE_OTHER): Payer: BC Managed Care – PPO | Admitting: Internal Medicine

## 2014-04-24 ENCOUNTER — Encounter: Payer: Self-pay | Admitting: Internal Medicine

## 2014-04-24 VITALS — BP 112/78 | HR 91 | Temp 98.1°F | Wt 152.0 lb

## 2014-04-24 DIAGNOSIS — B349 Viral infection, unspecified: Secondary | ICD-10-CM

## 2014-04-24 DIAGNOSIS — B9789 Other viral agents as the cause of diseases classified elsewhere: Secondary | ICD-10-CM

## 2014-04-24 DIAGNOSIS — J329 Chronic sinusitis, unspecified: Secondary | ICD-10-CM

## 2014-04-24 MED ORDER — AMOXICILLIN-POT CLAVULANATE 875-125 MG PO TABS: 1.0000 | ORAL_TABLET | Freq: Two times a day (BID) | ORAL | Status: DC

## 2014-04-24 NOTE — Progress Notes (Signed)
HPI  Pt presents to the clinic today with c/o facial pain and pressure, ear fullness, nasal congestion and sore throat. She reports this started 2 days ago. She is blowing clear mucous from her nose. Her cough is productive of yellow mucous. She has had some associated pain with swallowing, chills and body aches. She denies fever. She has tried zyrtec D with some relief. She does have a history of allergies and usually gets a sinus infection twice yearly. She has had sick contacts.  Review of Systems    Past Medical History  Diagnosis Date  . Gallstones 2014  . Arthritis   . Allergy     Family History  Problem Relation Age of Onset  . Cancer Maternal Uncle     colon  . Hypertension Mother   . Hyperlipidemia Mother   . Hypertension Father   . Hyperlipidemia Father     History   Social History  . Marital Status: Married    Spouse Name: N/A    Number of Children: N/A  . Years of Education: N/A   Occupational History  . Not on file.   Social History Main Topics  . Smoking status: Never Smoker   . Smokeless tobacco: Never Used  . Alcohol Use: Yes  . Drug Use: No  . Sexual Activity: Not on file   Other Topics Concern  . Not on file   Social History Narrative    No Known Allergies   Constitutional: Positive headache, fatigue. Denies fever or abrupt weight changes.  HEENT:  Positive facial pain, nasal congestion and sore throat. Denies eye redness, ear pain, ringing in the ears, wax buildup, runny nose or bloody nose. Respiratory: Positive cough. Denies difficulty breathing or shortness of breath.  Cardiovascular: Denies chest pain, chest tightness, palpitations or swelling in the hands or feet.   No other specific complaints in a complete review of systems (except as listed in HPI above).  Objective:  BP 112/78 mmHg  Pulse 91  Temp(Src) 98.1 F (36.7 C) (Oral)  Wt 152 lb (68.947 kg)  SpO2 98%  LMP 02/28/2014 (Approximate)   General: Appears her stated age,  in NAD. HEENT: Head: normal shape and size, maxillary sinus tenderness noted; Eyes: sclera white, no icterus, conjunctiva pink; Ears: Tm's gray and intact, normal light reflex; Nose: mucosa pink and moist, septum midline; Throat/Mouth: + PND. Teeth present, mucosa pink and moist, no exudate noted, no lesions or ulcerations noted.  Neck: No adenopathy noted. Cardiovascular: Normal rate and rhythm. S1,S2 noted.  No murmur, rubs or gallops noted.  Pulmonary/Chest: Normal effort and positive vesicular breath sounds. No respiratory distress. No wheezes, rales or ronchi noted.      Assessment & Plan:   Viral sinusitis  Can use a Neti Pot which can be purchased from your local drug store. Flonase 2 sprays each nostril for 3 days and then as needed. Continue Zyrtec D Will print RX for Augmentin BID for 10 days to start taking early next week if symptoms do not improve  RTC as needed or if symptoms persist.

## 2014-04-24 NOTE — Progress Notes (Signed)
Pre visit review using our clinic review tool, if applicable. No additional management support is needed unless otherwise documented below in the visit note. 

## 2014-04-24 NOTE — Patient Instructions (Signed)

## 2014-05-22 ENCOUNTER — Ambulatory Visit (INDEPENDENT_AMBULATORY_CARE_PROVIDER_SITE_OTHER): Payer: BC Managed Care – PPO | Admitting: Internal Medicine

## 2014-05-22 ENCOUNTER — Encounter: Payer: Self-pay | Admitting: Internal Medicine

## 2014-05-22 VITALS — BP 124/72 | HR 78 | Temp 98.7°F | Resp 16 | Ht 65.0 in | Wt 150.5 lb

## 2014-05-22 DIAGNOSIS — K8010 Calculus of gallbladder with chronic cholecystitis without obstruction: Secondary | ICD-10-CM

## 2014-05-22 DIAGNOSIS — F4323 Adjustment disorder with mixed anxiety and depressed mood: Secondary | ICD-10-CM

## 2014-05-22 DIAGNOSIS — K8020 Calculus of gallbladder without cholecystitis without obstruction: Secondary | ICD-10-CM

## 2014-05-22 DIAGNOSIS — R002 Palpitations: Secondary | ICD-10-CM

## 2014-05-22 MED ORDER — VENLAFAXINE HCL ER 37.5 MG PO CP24: 37.5000 mg | ORAL_CAPSULE | Freq: Every day | ORAL | Status: DC

## 2014-05-22 NOTE — Progress Notes (Signed)
Pre-visit discussion using our clinic review tool. No additional management support is needed unless otherwise documented below in the visit note.  

## 2014-05-22 NOTE — Progress Notes (Signed)
Patient ID: Stacy Alexander, female   DOB: 1976/05/01, 39 y.o.   MRN: [id number]  Patient Active Problem List   Diagnosis Date Noted  . Abdominal pain 03/30/2014  . Menorrhagia, premenopausal 03/30/2014  . Adjustment disorder with mixed anxiety and depressed mood 03/28/2014  . Neuropathy 03/28/2014  . Diarrhea 03/28/2014  . Palpitations 03/28/2014  . Gallstones     Subjective:  CC:   Chief Complaint  Patient presents with  . Follow-up    anxiety issue's    HPI:   Stacy Alexander is a 39 y.o. female who presents for  Follow up on Generalized anxiety and multiple other issues raised at her first office visit a month ago.   She is feeling much better on sertraline 25 mg daily,  But has noticed significant loss of libido and inability to achieve climax. Thhis is upsetting to her and husband and she is requesting a medication change.  She denies dyspareunia, marital strife, and use of marijuana.   She continues to have bilateral upper quadrant pain and has been advised to have her GB removed electively since she has gallstones.       Past Medical History  Diagnosis Date  . Gallstones 2014  . Arthritis   . Allergy     Past Surgical History  Procedure Laterality Date  . Cesarean section  [phone]       The following portions of the patient's history were reviewed and updated as appropriate: Allergies, current medications, and problem list.    Review of Systems:   Patient denies headache, fevers, malaise, unintentional weight loss, skin rash, eye pain, sinus congestion and sinus pain, sore throat, dysphagia,  hemoptysis , cough, dyspnea, wheezing, chest pain, palpitations, orthopnea, edema, abdominal pain, nausea, melena, diarrhea, constipation, flank pain, dysuria, hematuria, urinary  Frequency, nocturia, numbness, tingling, seizures,  Focal weakness, Loss of consciousness,  Tremor, insomnia, depression, anxiety, and suicidal ideation.     History   Social History   . Marital Status: Married    Spouse Name: N/A    Number of Children: N/A  . Years of Education: N/A   Occupational History  . Not on file.   Social History Main Topics  . Smoking status: Never Smoker   . Smokeless tobacco: Never Used  . Alcohol Use: Yes  . Drug Use: No  . Sexual Activity: Not on file   Other Topics Concern  . Not on file   Social History Narrative    Objective:  Filed Vitals:   05/22/14 1030  BP: 124/72  Pulse: 78  Temp: 98.7 F (37.1 C)  Resp: 16     General appearance: alert, cooperative and appears stated age Ears: normal TM's and external ear canals both ears Throat: lips, mucosa, and tongue normal; teeth and gums normal Neck: no adenopathy, no carotid bruit, supple, symmetrical, trachea midline and thyroid not enlarged, symmetric, no tenderness/mass/nodules Back: symmetric, no curvature. ROM normal. No CVA tenderness. Lungs: clear to auscultation bilaterally Heart: regular rate and rhythm, S1, S2 normal, no murmur, click, rub or gallop Abdomen: soft, non-tender; bowel sounds normal; no masses,  no organomegaly Pulses: 2+ and symmetric Skin: Skin color, texture, turgor normal. No rashes or lesions Lymph nodes: Cervical, supraclavicular, and axillary nodes normal.  Assessment and Plan:  Adjustment disorder with mixed anxiety and depressed mood We reviewed the choices availabel for alternative therapy,  Spending about 20 minutes on the issue.  Decided on effexor trial. She was advised to continue the sertraline for  a few more days while starting the effexor.    Gallstones She has decided to have her GB removed and understands that the bilateral upper quadrant pain may persist.    Palpitations reassurance provided that in the absence of presyncope, vertigo  and chest pain, no further workup is required at this time.     Updated Medication List Outpatient Encounter Prescriptions as of 05/22/2014  Medication Sig  . b complex vitamins  tablet Take 1 tablet by mouth daily.  . Cholecalciferol (VITAMIN D3) 1000 UNITS CAPS Take 2,000 Units by mouth daily.  . Melaton-Thean-Cham-PassF-LBalm (MELATONIN + L-THEANINE PO) Take by mouth.  . Multiple Vitamins-Minerals (MULTIVITAMIN WITH MINERALS) tablet Take 1 tablet by mouth daily.  . Omega-3 Fatty Acids (FISH OIL) 1000 MG CAPS Take 1 capsule by mouth daily.  Marland Kitchen. saccharomyces boulardii (FLORASTOR) 250 MG capsule Take 250 mg by mouth 2 (two) times daily.  . [DISCONTINUED] sertraline (ZOLOFT) 50 MG tablet Take 1 tablet (50 mg total) by mouth daily. (Patient taking differently: Take 25 mg by mouth daily. Patient taking 50 mg daily)  . amoxicillin-clavulanate (AUGMENTIN) 875-125 MG per tablet Take 1 tablet by mouth 2 (two) times daily. (Patient not taking: Reported on 05/22/2014)  . Iron-Vit C-Vit B12-Folic Acid (IRON 100 PLUS PO) Take by mouth.  . venlafaxine XR (EFFEXOR-XR) 37.5 MG 24 hr capsule Take 1 capsule (37.5 mg total) by mouth daily with breakfast.  . Vitamin D, Ergocalciferol, (DRISDOL) 50000 UNITS CAPS Take 50,000 Units by mouth.     Orders Placed This Encounter  Procedures  . US Abdomen Limited RUQ  . Ambulatory referral to General Surgery    No Follow-up on file.

## 2014-05-23 ENCOUNTER — Ambulatory Visit: Payer: Self-pay | Admitting: Internal Medicine

## 2014-05-24 ENCOUNTER — Encounter: Payer: Self-pay | Admitting: Family Medicine

## 2014-05-25 ENCOUNTER — Telehealth: Payer: Self-pay | Admitting: Internal Medicine

## 2014-05-25 DIAGNOSIS — K8020 Calculus of gallbladder without cholecystitis without obstruction: Secondary | ICD-10-CM

## 2014-05-25 NOTE — Assessment & Plan Note (Signed)
We reviewed the choices availabel for alternative therapy,  Spending about 20 minutes on the issue.  Decided on effexor trial. She was advised to continue the sertraline for a few more days while starting the effexor.

## 2014-05-25 NOTE — Assessment & Plan Note (Signed)
reassurance provided that in the absence of presyncope, vertigo  and chest pain, no further workup is required at this time.

## 2014-05-25 NOTE — Assessment & Plan Note (Signed)
She has decided to have her GB removed and understands that the bilateral upper quadrant pain may persist.  

## 2014-05-25 NOTE — Telephone Encounter (Signed)
US showed 1.2 cm mobile nonobstructing gallstone,  Normal common bile duct (no dilation), normal liver.  F/u with Gen Surg as discussed

## 2014-05-25 NOTE — Assessment & Plan Note (Addendum)
She has decided to have her GB removed and understands that the bilateral upper quadrant pain may persist.

## 2014-05-27 NOTE — Telephone Encounter (Signed)
Patient notified and voiced understanding.

## 2014-06-10 ENCOUNTER — Encounter: Payer: Self-pay | Admitting: Internal Medicine

## 2014-06-17 ENCOUNTER — Ambulatory Visit (INDEPENDENT_AMBULATORY_CARE_PROVIDER_SITE_OTHER): Payer: BC Managed Care – PPO | Admitting: General Surgery

## 2014-06-17 ENCOUNTER — Encounter: Payer: Self-pay | Admitting: General Surgery

## 2014-06-17 VITALS — BP 134/84 | HR 72 | Resp 12 | Ht 64.0 in | Wt 145.0 lb

## 2014-06-17 DIAGNOSIS — K8020 Calculus of gallbladder without cholecystitis without obstruction: Secondary | ICD-10-CM

## 2014-06-17 NOTE — Patient Instructions (Addendum)

## 2014-06-17 NOTE — Progress Notes (Signed)
Patient ID: Stacy Alexander, female   DOB: 11/21/1975, 39 y.o.   MRN: [id number]  Chief Complaint  Patient presents with  . Other    Chronic cholecystitis    HPI Stacy Alexander is a 39 y.o. female here today for reevaluation of chronic cholecystitis and cholelithiasis.. Patient states she is still intermittently having a lot of pain in her right upper quadrant. At the time of her original evaluation in July 2014 she had had intermittent symptoms for 4 years. Ultrasound was performed 05/23/14. Patient states she is now ready to discuss gallbladder removal. She states diets has helped.  HPI  Past Medical History  Diagnosis Date  . Gallstones 2014  . Arthritis   . Allergy     Past Surgical History  Procedure Laterality Date  . Cesarean section  2010,2013    Family History  Problem Relation Age of Onset  . Cancer Maternal Uncle     colon  . Hypertension Mother   . Hyperlipidemia Mother   . Hypertension Father   . Hyperlipidemia Father     Social History History  Substance Use Topics  . Smoking status: Never Smoker   . Smokeless tobacco: Never Used  . Alcohol Use: Yes    No Known Allergies  Current Outpatient Prescriptions  Medication Sig Dispense Refill  . amoxicillin-clavulanate (AUGMENTIN) 875-125 MG per tablet Take 1 tablet by mouth 2 (two) times daily. 20 tablet 0  . b complex vitamins tablet Take 1 tablet by mouth daily.    . Cholecalciferol (VITAMIN D3) 1000 UNITS CAPS Take 2,000 Units by mouth daily.    . Iron-Vit C-Vit B12-Folic Acid (IRON 100 PLUS PO) Take by mouth.    . Melaton-Thean-Cham-PassF-LBalm (MELATONIN + L-THEANINE PO) Take by mouth.    . Multiple Vitamins-Minerals (MULTIVITAMIN WITH MINERALS) tablet Take 1 tablet by mouth daily.    . Omega-3 Fatty Acids (FISH OIL) 1000 MG CAPS Take 1 capsule by mouth daily.    Marland Kitchen. saccharomyces boulardii (FLORASTOR) 250 MG capsule Take 250 mg by mouth 2 (two) times daily.    Marland Kitchen. venlafaxine XR (EFFEXOR-XR) 37.5 MG 24  hr capsule Take 1 capsule (37.5 mg total) by mouth daily with breakfast. 30 capsule 2  . Vitamin D, Ergocalciferol, (DRISDOL) 50000 UNITS CAPS Take 50,000 Units by mouth.     No current facility-administered medications for this visit.    Review of Systems Review of Systems  Constitutional: Negative.   Respiratory: Negative.   Cardiovascular: Negative.   Gastrointestinal: Positive for abdominal pain, diarrhea and constipation. Negative for nausea, vomiting, blood in stool, abdominal distention, anal bleeding and rectal pain.    Blood pressure 134/84, pulse 72, resp. rate 12, height 5\' 4"  (1.626 m), weight 145 lb (65.772 kg), last menstrual period 05/29/2013.  Physical Exam Physical Exam  Constitutional: She is oriented to person, place, and time. She appears well-developed and well-nourished.  Eyes: Conjunctivae are normal. No scleral icterus.  Neck: Neck supple.  Cardiovascular: Normal rate, regular rhythm and normal heart sounds.   Pulmonary/Chest: Effort normal and breath sounds normal.  Abdominal: Soft. Normal appearance and bowel sounds are normal. There is no tenderness.  Lymphadenopathy:    She has no cervical adenopathy.    She has no axillary adenopathy.  Neurological: She is alert and oriented to person, place, and time.  Skin: Skin is warm and dry.    Data Reviewed Demerol ultrasound dated 05/23/2014 showed a 12 mm mobile gallstone. No gallbladder wall thickening or pericholecystic fluid. Normal  common bile duct.  Assessment    Cholelithiasis, likely chronic cholecystitis.    Plan    The patient has not had any severe episodes of pain, and she is really on the fence of whether she wants to proceed with cholecystectomy. We discussed the pros and cons of elective procedure. She has a 4+ decade life expectancy, and I anticipate she will have troublesome or along the way from her gallstones.    Patient is scheduled for surgery at [facility] on 08/07/14. She will pre admit  by phone on 07/29/14. Patient is aware of date and instructions.   PCP:  Macky Lower 06/18/2014, 4:01 PM

## 2014-06-18 ENCOUNTER — Telehealth: Payer: Self-pay | Admitting: *Deleted

## 2014-06-18 DIAGNOSIS — K8020 Calculus of gallbladder without cholecystitis without obstruction: Secondary | ICD-10-CM | POA: Insufficient documentation

## 2014-06-18 NOTE — Telephone Encounter (Signed)
Message for patient to call the office.  We need to get patient scheduled for a pre-op visit prior to surgery [facility]. She is to phone interview on 07-29-14. It would be great if patient could come in the week before phone interview to allow time for Dr. Lemar LivingsByrnett to complete the orders prior to scheduled phone interview.

## 2014-06-19 ENCOUNTER — Telehealth: Payer: Self-pay | Admitting: *Deleted

## 2014-06-19 NOTE — Telephone Encounter (Signed)
Patient called the office back and wanted to see about rescheduling surgery within the time frame so she would not need to come back in for a pre-op visit.   This patient's surgery has been rescheduled from 08-07-14 to 07-10-14 at [facility].   Message left on [employer] posting line regarding date change.

## 2014-06-25 ENCOUNTER — Ambulatory Visit (INDEPENDENT_AMBULATORY_CARE_PROVIDER_SITE_OTHER): Payer: BC Managed Care – PPO | Admitting: Nurse Practitioner

## 2014-06-25 ENCOUNTER — Encounter: Payer: Self-pay | Admitting: Nurse Practitioner

## 2014-06-25 VITALS — BP 132/82 | HR 92 | Temp 97.8°F | Resp 12 | Ht 65.0 in | Wt 157.8 lb

## 2014-06-25 DIAGNOSIS — J0131 Acute recurrent sphenoidal sinusitis: Secondary | ICD-10-CM

## 2014-06-25 MED ORDER — DOXYCYCLINE HYCLATE 100 MG PO TABS: 100.0000 mg | ORAL_TABLET | Freq: Two times a day (BID) | ORAL | Status: DC

## 2014-06-25 MED ORDER — HYDROCOD POLST-CHLORPHEN POLST 10-8 MG/5ML PO LQCR: 5.0000 mL | Freq: Every evening | ORAL | Status: DC | PRN

## 2014-06-25 NOTE — Patient Instructions (Signed)
Call us if it fails to improve.

## 2014-06-25 NOTE — Progress Notes (Signed)
   Subjective:    Patient ID: Stacy Alexander, female    DOB: 07/01/1975, 39 y.o.   MRN: [id number]  HPI   Ms. Stacy Alexander is a 39 yo female with a CC of green mucous, pain behind the eyes, chills x 8 days.   1) Went to [facility] in Dec. Treated with Augmentin, Flonase, and EchoStareti Pot. Resolved. Symptoms returned 8 days ago, Zyrtec-D, mucinex, and Neti-pot and nasonex without relief.    Review of Systems  Constitutional: Positive for chills. Negative for fever, diaphoresis and fatigue.  HENT: Positive for congestion, nosebleeds, postnasal drip, rhinorrhea, sinus pressure, sneezing and sore throat. Negative for ear discharge and ear pain.   Eyes: Negative for visual disturbance.  Respiratory: Positive for cough. Negative for chest tightness, shortness of breath and wheezing.   Cardiovascular: Negative for chest pain, palpitations and leg swelling.  Gastrointestinal: Negative for nausea, vomiting and diarrhea.  Skin: Negative for rash.  Neurological: Negative for dizziness, weakness, numbness and headaches.  Psychiatric/Behavioral: Positive for sleep disturbance. The patient is not nervous/anxious.       Objective:   Physical Exam  Constitutional: She is oriented to person, place, and time. She appears well-developed and well-nourished. No distress.  BP 132/82 mmHg  Pulse 92  Temp(Src) 97.8 F (36.6 C) (Oral)  Resp 12  Ht 5\' 5"  (1.651 m)  Wt 157 lb 12.8 oz (71.578 kg)  BMI 26.26 kg/m2  SpO2 99%  LMP  (Approximate)   HENT:  Head: Normocephalic and atraumatic.  Right Ear: External ear normal.  Left Ear: External ear normal.  Mouth/Throat: No oropharyngeal exudate.  Boggy turbinates TM's slightly injected bilaterally  Eyes: Right eye exhibits no discharge. Left eye exhibits no discharge. No scleral icterus.  Cardiovascular: Normal rate, regular rhythm, normal heart sounds and intact distal pulses.  Exam reveals no gallop and no friction rub.   No murmur heard. Pulmonary/Chest:  Effort normal and breath sounds normal. No respiratory distress. She has no wheezes. She has no rales. She exhibits no tenderness.  Neurological: She is alert and oriented to person, place, and time. No cranial nerve deficit. She exhibits normal muscle tone. Coordination normal.  Skin: Skin is warm and dry. No rash noted. She is not diaphoretic.  Psychiatric: She has a normal mood and affect. Her behavior is normal. Judgment and thought content normal.      Assessment & Plan:

## 2014-06-25 NOTE — Progress Notes (Signed)
Pre visit review using our clinic review tool, if applicable. No additional management support is needed unless otherwise documented below in the visit note. 

## 2014-06-28 DIAGNOSIS — J019 Acute sinusitis, unspecified: Secondary | ICD-10-CM | POA: Insufficient documentation

## 2014-06-28 NOTE — Assessment & Plan Note (Signed)
Worsening. Pt not getting relief from OTC measures and feels worse than when it began. Will try doxycyline twice daily for 7 days. Continue other OTC measures. FU if worsening/failure to improve.

## 2014-06-30 ENCOUNTER — Other Ambulatory Visit: Payer: Self-pay | Admitting: General Surgery

## 2014-06-30 DIAGNOSIS — K8020 Calculus of gallbladder without cholecystitis without obstruction: Secondary | ICD-10-CM

## 2014-07-03 ENCOUNTER — Other Ambulatory Visit (HOSPITAL_COMMUNITY)
Admission: RE | Admit: 2014-07-03 | Discharge: 2014-07-03 | Disposition: A | Discharge status: Home / Self Care | Payer: BC Managed Care – PPO | Source: Ambulatory Visit | Attending: Internal Medicine | Admitting: Internal Medicine

## 2014-07-03 ENCOUNTER — Encounter: Payer: Self-pay | Admitting: Internal Medicine

## 2014-07-03 ENCOUNTER — Ambulatory Visit (INDEPENDENT_AMBULATORY_CARE_PROVIDER_SITE_OTHER): Payer: BC Managed Care – PPO | Admitting: Internal Medicine

## 2014-07-03 VITALS — BP 138/78 | HR 81 | Temp 98.3°F | Resp 14 | Ht 64.5 in | Wt 154.5 lb

## 2014-07-03 DIAGNOSIS — Z1151 Encounter for screening for human papillomavirus (HPV): Secondary | ICD-10-CM | POA: Insufficient documentation

## 2014-07-03 DIAGNOSIS — R0789 Other chest pain: Secondary | ICD-10-CM

## 2014-07-03 DIAGNOSIS — Z01419 Encounter for gynecological examination (general) (routine) without abnormal findings: Secondary | ICD-10-CM | POA: Diagnosis not present

## 2014-07-03 DIAGNOSIS — Z0000 Encounter for general adult medical examination without abnormal findings: Secondary | ICD-10-CM

## 2014-07-03 DIAGNOSIS — R079 Chest pain, unspecified: Secondary | ICD-10-CM

## 2014-07-03 DIAGNOSIS — R002 Palpitations: Secondary | ICD-10-CM

## 2014-07-03 DIAGNOSIS — Z124 Encounter for screening for malignant neoplasm of cervix: Secondary | ICD-10-CM

## 2014-07-03 MED ORDER — VENLAFAXINE HCL ER 75 MG PO CP24: 75.0000 mg | ORAL_CAPSULE | Freq: Every day | ORAL | Status: DC

## 2014-07-03 NOTE — Progress Notes (Signed)
Pre-visit discussion using our clinic review tool. No additional management support is needed unless otherwise documented below in the visit note.  

## 2014-07-03 NOTE — Progress Notes (Signed)
Patient ID: [name], female   DOB: 11/21/1975, 39 y.o.   MRN: [id number]   Subjective:     Stacy Alexander is a 39 y.o. female and is here for a comprehensive physical exam. The patient reports recurrent  chest pain and palpitations.   Cc: has been having intermittent palpitations and  Substernal chest pain which occur with exercise.  States that after several minutes it "feels like an elephant is sitting on my chest."  The symptoms  lasts 30 secs and have  been occurring for nearly 2 years ,  But she neglected to mention it until her scheduled annual exam however in less than 7 days she is scheduled to have  an elective cholecystectomy .  She has a FH of tobacco abuse but no personal CRFs for heart disease other than passive smoke exposure . Both parents smoked .     2) continued sinus drainage,  She was treated for sinusitis with doxy 7  Days starting on  feb 16   Sinus pressure has resolved and her drainage is now clear and cough has improved.       History   Social History  . Marital Status: Married    Spouse Name: N/A  . Number of Children: N/A  . Years of Education: N/A   Occupational History  . Not on file.   Social History Main Topics  . Smoking status: Never Smoker   . Smokeless tobacco: Never Used  . Alcohol Use: Yes  . Drug Use: No  . Sexual Activity: Not on file   Other Topics Concern  . Not on file   Social History Narrative   Health Maintenance  Topic Date Due  . HIV Screening  11/09/1992  . INFLUENZA VACCINE  12/08/2014  . PAP SMEAR  03/31/2015  . TETANUS/TDAP  12/07/2021    The following portions of the patient's history were reviewed and updated as appropriate: allergies, current medications, past family history, past medical history, past social history, past surgical history and problem list.  Review of Systems Patient denies headache, fevers, malaise, unintentional weight loss, skin rash, eye pain, sinus congestion , sore throat, dysphagia,   hemoptysis , cough, dyspnea, wheezing, , orthopnea, edema, abdominal pain, nausea, melena, diarrhea, constipation, flank pain, dysuria, hematuria, urinary  Frequency, nocturia, numbness, tingling, seizures,  Focal weakness, Loss of consciousness,  Tremor, insomnia, depression, anxiety, and suicidal ideation.      Objective:    BP 138/78 mmHg  Pulse 81  Temp(Src) 98.3 F (36.8 C) (Oral)  Resp 14  Ht 5' 4.5" (1.638 m)  Wt 154 lb 8 oz (70.081 kg)  BMI 26.12 kg/m2  SpO2 100%  LMP 06/22/2014 (Approximate)  General Appearance:    Alert, cooperative, no distress, appears stated age  Head:    Normocephalic, without obvious abnormality, atraumatic  Eyes:    PERRL, conjunctiva/corneas clear, EOM's intact, fundi    benign, both eyes  Ears:    Normal TM's and external ear canals, both ears  Nose:   Nares normal, septum midline, mucosa normal, no sinus tenderness  Throat:   Lips, mucosa, and tongue normal; teeth and gums normal  Neck:   Supple, symmetrical, trachea midline, no adenopathy;    thyroid:  no enlargement/tenderness/nodules; no carotid   bruit or JVD  Back:     Symmetric, no curvature, ROM normal, no CVA tenderness  Lungs:     Clear to auscultation bilaterally, respirations unlabored  Chest Wall:    No tenderness  or deformity   Heart:    Regular rate and rhythm, S1 and S2 normal, no murmur, rub   or gallop  Breast Exam:    No tenderness, masses, or nipple abnormality  Abdomen:     Soft, non-tender, bowel sounds active all four quadrants,    no masses, no organomegaly  Genitalia:    Pelvic: cervix normal in appearance, external genitalia normal, no adnexal masses or tenderness, no cervical motion tenderness, rectovaginal septum normal, uterus normal size, shape, and consistency and vagina normal without discharge  Extremities:   Extremities normal, atraumatic, no cyanosis or edema  Pulses:   2+ and symmetric all extremities  Skin:   Skin color, texture, turgor normal, no rashes or  lesions  Lymph nodes:   Cervical, supraclavicular, and axillary nodes normal  Neurologic:   CNII-XII intact, normal strength, sensation and reflexes    throughout     Assessment and Plan:   Problem List Items Addressed This Visit    Palpitations    Recurrent, per patient., occurring with exertion. And accompanied by chest pain for the last year or more.  Cardiology evaluation advised given upcoming cholecystectomy march 3rd.   Lab Results  Component Value Date   TSH 0.99 03/28/2014   Lab Results  Component Value Date   NA 139 03/28/2014   K 3.7 03/28/2014   CL 106 03/28/2014   CO2 26 03/28/2014         Relevant Orders   EKG 12-Lead (Completed)   Ambulatory referral to Cardiology   Encounter for preventive health examination    Annual wellness  exam was done as well as a comprehensive physical exam and management of acute and chronic conditions .  During the course of the visit the patient was educated and counseled about appropriate screening and preventive services including :  diabetes screening, lipid analysis with projected  10 year  risk for CAD , nutrition counseling, colorectal cancer screening, and recommended immunizations.  Printed recommendations for health maintenance screenings was given.        Chest pain on exertion    Patient needs urgent cardiology evaluation as she is scheduled to undergo cholecystectomy on thursday March 3rd, and has been having exertional chest pain with palpitations for over a year.  She is scheduled to see Dr Kirke CorinArida on Monday, feb 29th.  Baseline  EKG was normal in office today , there are no priro EKGs for comparison by review of [facility]        Other Visit Diagnoses    Other chest pain    -  Primary    Relevant Orders    Ambulatory referral to Cardiology    Pap smear for cervical cancer screening        Relevant Orders    Cytology - PAP

## 2014-07-03 NOTE — Patient Instructions (Addendum)
Am referring you to [facility] cardiology for an evaluation of your heart   Dr Fletcher Anon March 1 3 PM    We discussed increasing your effexor to 75 mg daily   Health Maintenance Adopting a healthy lifestyle and getting preventive care can go a long way to promote health and wellness. Talk with your health care provider about what schedule of regular examinations is right for you. This is a good chance for you to check in with your provider about disease prevention and staying healthy. In between checkups, there are plenty of things you can do on your own. Experts have done a lot of research about which lifestyle changes and preventive measures are most likely to keep you healthy. Ask your health care provider for more information. WEIGHT AND DIET  Eat a healthy diet  Be sure to include plenty of vegetables, fruits, low-fat dairy products, and lean protein.  Do not eat a lot of foods high in solid fats, added sugars, or salt.  Get regular exercise. This is one of the most important things you can do for your health.  Most adults should exercise for at least 150 minutes each week. The exercise should increase your heart rate and make you sweat (moderate-intensity exercise).  Most adults should also do strengthening exercises at least twice a week. This is in addition to the moderate-intensity exercise.  Maintain a healthy weight  Body mass index (BMI) is a measurement that can be used to identify possible weight problems. It estimates body fat based on height and weight. Your health care provider can help determine your BMI and help you achieve or maintain a healthy weight.  For females 71 years of age and older:   A BMI below 18.5 is considered underweight.  A BMI of 18.5 to 24.9 is normal.  A BMI of 25 to 29.9 is considered overweight.  A BMI of 30 and above is considered obese.  Watch levels of cholesterol and blood lipids  You should start having your blood tested for lipids and  cholesterol at 37 years of age, then have this test every 5 years.  You may need to have your cholesterol levels checked more often if:  Your lipid or cholesterol levels are high.  You are older than 37 years of age.  You are at high risk for heart disease.  CANCER SCREENING   Lung Cancer  Lung cancer screening is recommended for adults 69-43 years old who are at high risk for lung cancer because of a history of smoking.  A yearly low-dose CT scan of the lungs is recommended for people who:  Currently smoke.  Have quit within the past 15 years.  Have at least a 30-pack-year history of smoking. A pack year is smoking an average of one pack of cigarettes a day for 1 year.  Yearly screening should continue until it has been 15 years since you quit.  Yearly screening should stop if you develop a health problem that would prevent you from having lung cancer treatment.  Breast Cancer  Practice breast self-awareness. This means understanding how your breasts normally appear and feel.  It also means doing regular breast self-exams. Let your health care provider know about any changes, no matter how small.  If you are in your 20s or 30s, you should have a clinical breast exam (CBE) by a health care provider every 1-3 years as part of a regular health exam.  If you are 54 or older, have a CBE  every year. Also consider having a breast X-ray (mammogram) every year.  If you have a family history of breast cancer, talk to your health care provider about genetic screening.  If you are at high risk for breast cancer, talk to your health care provider about having an MRI and a mammogram every year.  Breast cancer gene (BRCA) assessment is recommended for women who have family members with BRCA-related cancers. BRCA-related cancers include:  Breast.  Ovarian.  Tubal.  Peritoneal cancers.  Results of the assessment will determine the need for genetic counseling and BRCA1 and BRCA2  testing. Cervical Cancer Routine pelvic examinations to screen for cervical cancer are no longer recommended for nonpregnant women who are considered low risk for cancer of the pelvic organs (ovaries, uterus, and vagina) and who do not have symptoms. A pelvic examination may be necessary if you have symptoms including those associated with pelvic infections. Ask your health care provider if a screening pelvic exam is right for you.   The Pap test is the screening test for cervical cancer for women who are considered at risk.  If you had a hysterectomy for a problem that was not cancer or a condition that could lead to cancer, then you no longer need Pap tests.  If you are older than 65 years, and you have had normal Pap tests for the past 10 years, you no longer need to have Pap tests.  If you have had past treatment for cervical cancer or a condition that could lead to cancer, you need Pap tests and screening for cancer for at least 20 years after your treatment.  If you no longer get a Pap test, assess your risk factors if they change (such as having a new sexual partner). This can affect whether you should start being screened again.  Some women have medical problems that increase their chance of getting cervical cancer. If this is the case for you, your health care provider may recommend more frequent screening and Pap tests.  The human papillomavirus (HPV) test is another test that may be used for cervical cancer screening. The HPV test looks for the virus that can cause cell changes in the cervix. The cells collected during the Pap test can be tested for HPV.  The HPV test can be used to screen women 93 years of age and older. Getting tested for HPV can extend the interval between normal Pap tests from three to five years.  An HPV test also should be used to screen women of any age who have unclear Pap test results.  After 37 years of age, women should have HPV testing as often as Pap  tests.  Colorectal Cancer  This type of cancer can be detected and often prevented.  Routine colorectal cancer screening usually begins at 37 years of age and continues through 37 years of age.  Your health care provider may recommend screening at an earlier age if you have risk factors for colon cancer.  Your health care provider may also recommend using home test kits to check for hidden blood in the stool.  A small camera at the end of a tube can be used to examine your colon directly (sigmoidoscopy or colonoscopy). This is done to check for the earliest forms of colorectal cancer.  Routine screening usually begins at age 25.  Direct examination of the colon should be repeated every 5-10 years through 37 years of age. However, you may need to be screened more often if  early forms of precancerous polyps or small growths are found. Skin Cancer  Check your skin from head to toe regularly.  Tell your health care provider about any new moles or changes in moles, especially if there is a change in a mole's shape or color.  Also tell your health care provider if you have a mole that is larger than the size of a pencil eraser.  Always use sunscreen. Apply sunscreen liberally and repeatedly throughout the day.  Protect yourself by wearing long sleeves, pants, a wide-brimmed hat, and sunglasses whenever you are outside. HEART DISEASE, DIABETES, AND HIGH BLOOD PRESSURE   Have your blood pressure checked at least every 1-2 years. High blood pressure causes heart disease and increases the risk of stroke.  If you are between 65 years and 59 years old, ask your health care provider if you should take aspirin to prevent strokes.  Have regular diabetes screenings. This involves taking a blood sample to check your fasting blood sugar level.  If you are at a normal weight and have a low risk for diabetes, have this test once every three years after 37 years of age.  If you are overweight and  have a high risk for diabetes, consider being tested at a younger age or more often. PREVENTING INFECTION  Hepatitis B  If you have a higher risk for hepatitis B, you should be screened for this virus. You are considered at high risk for hepatitis B if:  You were born in a country where hepatitis B is common. Ask your health care provider which countries are considered high risk.  Your parents were born in a high-risk country, and you have not been immunized against hepatitis B (hepatitis B vaccine).  You have HIV or AIDS.  You use needles to inject street drugs.  You live with someone who has hepatitis B.  You have had sex with someone who has hepatitis B.  You get hemodialysis treatment.  You take certain medicines for conditions, including cancer, organ transplantation, and autoimmune conditions. Hepatitis C  Blood testing is recommended for:  Everyone born from [birth year] through 1963.  Anyone with known risk factors for hepatitis C. Sexually transmitted infections (STIs)  You should be screened for sexually transmitted infections (STIs) including gonorrhea and chlamydia if:  You are sexually active and are younger than 37 years of age.  You are older than 37 years of age and your health care provider tells you that you are at risk for this type of infection.  Your sexual activity has changed since you were last screened and you are at an increased risk for chlamydia or gonorrhea. Ask your health care provider if you are at risk.  If you do not have HIV, but are at risk, it may be recommended that you take a prescription medicine daily to prevent HIV infection. This is called pre-exposure prophylaxis (PrEP). You are considered at risk if:  You are sexually active and do not regularly use condoms or know the HIV status of your partner(s).  You take drugs by injection.  You are sexually active with a partner who has HIV. Talk with your health care provider about whether you  are at high risk of being infected with HIV. If you choose to begin PrEP, you should first be tested for HIV. You should then be tested every 3 months for as long as you are taking PrEP.  PREGNANCY   If you are premenopausal and you may become pregnant, ask  your health care provider about preconception counseling.  If you may become pregnant, take 400 to 800 micrograms (mcg) of folic acid every day.  If you want to prevent pregnancy, talk to your health care provider about birth control (contraception). OSTEOPOROSIS AND MENOPAUSE   Osteoporosis is a disease in which the bones lose minerals and strength with aging. This can result in serious bone fractures. Your risk for osteoporosis can be identified using a bone density scan.  If you are 24 years of age or older, or if you are at risk for osteoporosis and fractures, ask your health care provider if you should be screened.  Ask your health care provider whether you should take a calcium or vitamin D supplement to lower your risk for osteoporosis.  Menopause may have certain physical symptoms and risks.  Hormone replacement therapy may reduce some of these symptoms and risks. Talk to your health care provider about whether hormone replacement therapy is right for you.  HOME CARE INSTRUCTIONS   Schedule regular health, dental, and eye exams.  Stay current with your immunizations.   Do not use any tobacco products including cigarettes, chewing tobacco, or electronic cigarettes.  If you are pregnant, do not drink alcohol.  If you are breastfeeding, limit how much and how often you drink alcohol.  Limit alcohol intake to no more than 1 drink per day for nonpregnant women. One drink equals 12 ounces of beer, 5 ounces of wine, or 1 ounces of hard liquor.  Do not use street drugs.  Do not share needles.  Ask your health care provider for help if you need support or information about quitting drugs.  Tell your health care provider if  you often feel depressed.  Tell your health care provider if you have ever been abused or do not feel safe at home. Document Released: 11/08/2010 Document Revised: 09/09/2013 Document Reviewed: 03/27/2013 [facility] Patient Information 2015 [place], [place]. This information is not intended to replace advice given to you by your health care provider. Make sure you discuss any questions you have with your health care provider.

## 2014-07-05 DIAGNOSIS — Z0000 Encounter for general adult medical examination without abnormal findings: Secondary | ICD-10-CM | POA: Insufficient documentation

## 2014-07-05 DIAGNOSIS — R079 Chest pain, unspecified: Secondary | ICD-10-CM | POA: Insufficient documentation

## 2014-07-05 NOTE — Assessment & Plan Note (Addendum)
Patient needs urgent cardiology evaluation as she is scheduled to undergo cholecystectomy on thursday March 3rd, and has been having exertional chest pain with palpitations for over a year.  She is scheduled to see Dr Kirke CorinArida on Monday, feb 29th.  Baseline  EKG was normal in office today , there are no priro EKGs [facility]

## 2014-07-05 NOTE — Assessment & Plan Note (Signed)
Recurrent, per patient., occurring with exertion. And accompanied by chest pain for the last year or more.  Cardiology evaluation advised given upcoming cholecystectomy march 3rd.   Lab Results  Component Value Date   TSH 0.99 03/28/2014   Lab Results  Component Value Date   NA 139 03/28/2014   K 3.7 03/28/2014   CL 106 03/28/2014   CO2 26 03/28/2014

## 2014-07-05 NOTE — Assessment & Plan Note (Signed)

## 2014-07-07 LAB — CYTOLOGY - PAP

## 2014-07-08 ENCOUNTER — Encounter: Payer: Self-pay | Admitting: *Deleted

## 2014-07-08 ENCOUNTER — Encounter: Payer: Self-pay | Admitting: Cardiovascular Disease

## 2014-07-08 ENCOUNTER — Ambulatory Visit: Payer: BC Managed Care – PPO | Admitting: Cardiovascular Disease

## 2014-07-08 ENCOUNTER — Ambulatory Visit (INDEPENDENT_AMBULATORY_CARE_PROVIDER_SITE_OTHER): Payer: BC Managed Care – PPO | Admitting: Cardiovascular Disease

## 2014-07-08 VITALS — BP 138/82 | HR 89 | Ht 64.0 in | Wt 152.5 lb

## 2014-07-08 DIAGNOSIS — R002 Palpitations: Secondary | ICD-10-CM | POA: Diagnosis not present

## 2014-07-08 DIAGNOSIS — R079 Chest pain, unspecified: Secondary | ICD-10-CM

## 2014-07-08 NOTE — Patient Instructions (Addendum)
Your physician has requested that you have an exercise tolerance test. For further information please visit [email or web address]. Please also follow instruction sheet, as given. -eat breakfast  -take more meds -comfortable cloths and lace up walking shoes -no lotion on the body   Your physician recommends that you schedule a follow-up appointment in:  As needed   GXT NEEDS TO BE TOMORROW AM

## 2014-07-09 ENCOUNTER — Telehealth: Payer: Self-pay | Admitting: *Deleted

## 2014-07-09 ENCOUNTER — Ambulatory Visit (INDEPENDENT_AMBULATORY_CARE_PROVIDER_SITE_OTHER): Payer: BC Managed Care – PPO | Admitting: Cardiovascular Disease

## 2014-07-09 ENCOUNTER — Encounter: Payer: Self-pay | Admitting: *Deleted

## 2014-07-09 ENCOUNTER — Telehealth: Payer: Self-pay

## 2014-07-09 DIAGNOSIS — R079 Chest pain, unspecified: Secondary | ICD-10-CM

## 2014-07-09 NOTE — Telephone Encounter (Signed)
She states she is much better from the sinus infection and feels now it may be seasonal allergies. She thought they told her she was having PVC's. Her stress test was this morning and she states she "did good" with out problems. Her sons just recovered from hand foot & mouth syndrome. She denies fever.

## 2014-07-09 NOTE — Procedures (Signed)
Treadmill ordered for recent epsiodes of chest pain.  Resting EKG shows NSR with rate of 80 bpm, no ST or T wave changes Resting blood pressure of 129/84 Stand bruce protocal was used.  Patient exercised for 10 min 21 sec,  Peak heart rate of 162 bpm.  This was 88% of the maximum predicted heart rate of 184 bpm Achieved 12.3 METS No symptoms of chest pain or lightheadedness were reported at peak stress or in recovery.  Peak Blood pressure recorded was 150/89 Heart rate at 3 minutes in recovery was 100 bpm. No ST changes concerning for ischemia. No significant ectopy or arrhythmia noted  FINAL IMPRESSION: Normal exercise stress test. No significant EKG changes concerning for ischemia. Excellent exercise tolerance.

## 2014-07-09 NOTE — Telephone Encounter (Signed)
She reports that her symptoms have cleared up and she is feeling much better.

## 2014-07-09 NOTE — Telephone Encounter (Signed)
-----   Message from Jeffrey W Byrnett, MD sent at 07/09/2014  4:14 PM EST ----- Please contact the patient and see if her sinus symptoms have resolved. For surgery tomorrow. 

## 2014-07-09 NOTE — Patient Instructions (Signed)
Your stress test was normal.

## 2014-07-09 NOTE — Telephone Encounter (Signed)
-----   Message from Stacy MayotteJeffrey W Byrnett, MD sent at 07/09/2014  4:14 PM EST ----- Please contact the patient and see if her sinus symptoms have resolved. For surgery tomorrow.

## 2014-07-10 ENCOUNTER — Encounter: Payer: Self-pay | Admitting: General Surgery

## 2014-07-10 ENCOUNTER — Ambulatory Visit: Payer: Self-pay | Admitting: General Surgery

## 2014-07-10 DIAGNOSIS — K8010 Calculus of gallbladder with chronic cholecystitis without obstruction: Secondary | ICD-10-CM | POA: Diagnosis not present

## 2014-07-10 HISTORY — PX: CHOLECYSTECTOMY: SHX55

## 2014-07-11 ENCOUNTER — Encounter: Payer: Self-pay | Admitting: General Surgery

## 2014-07-13 NOTE — Assessment & Plan Note (Signed)
The symptoms are overall vague and could be related to premature beats. She has minimal risk factors for coronary artery disease. Cardiac exam is unremarkable and baseline ECG is normal. Given that she will be undergoing surgery this week, I requested a treadmill stress test for evaluation.

## 2014-07-13 NOTE — Progress Notes (Signed)
Primary care physician: Dr. Darrick Huntsmanullo  HPI  This is a pleasant 39 year old female who was referred for evaluation of chest pain and palpitations. She is having cholecystectomy done this week on Thursday. She has no previous cardiac history. She is not a smoker and has no family history of coronary artery disease. She usually exercises for about 20 minutes on the treadmill. She occasionally feels an extra beat when exercising with occasional sensation of chest tightness. This has been intermittent over the last 6 months but she did not mention this until recently. She denies any shortness of breath. No excessive caffeine intake.  No Known Allergies   Current Outpatient Prescriptions on File Prior to Visit  Medication Sig Dispense Refill  . b complex vitamins tablet Take 1 tablet by mouth daily.    . chlorpheniramine-HYDROcodone (TUSSIONEX) 10-8 MG/5ML LQCR Take 5 mLs by mouth at bedtime as needed for cough. 115 mL 0  . Cholecalciferol (VITAMIN D3) 1000 UNITS CAPS Take 1,000 Units by mouth daily.     . Iron-Vit C-Vit B12-Folic Acid (IRON 100 PLUS PO) Take by mouth.    . Melaton-Thean-Cham-PassF-LBalm (MELATONIN + L-THEANINE PO) Take by mouth.    . Multiple Vitamins-Minerals (MULTIVITAMIN WITH MINERALS) tablet Take 1 tablet by mouth daily.    . Omega-3 Fatty Acids (FISH OIL) 1000 MG CAPS Take 1 capsule by mouth daily.    Marland Kitchen. saccharomyces boulardii (FLORASTOR) 250 MG capsule Take 250 mg by mouth 2 (two) times daily.    Marland Kitchen. venlafaxine XR (EFFEXOR-XR) 75 MG 24 hr capsule Take 1 capsule (75 mg total) by mouth daily with breakfast. 30 capsule 1  . Vitamin D, Ergocalciferol, (DRISDOL) 50000 UNITS CAPS Take 50,000 Units by mouth.     No current facility-administered medications on file prior to visit.     Past Medical History  Diagnosis Date  . Gallstones 2014  . Arthritis   . Allergy      Past Surgical History  Procedure Laterality Date  . Cesarean section  2010,2013     Family History    Problem Relation Age of Onset  . Cancer Maternal Uncle     colon  . Hypertension Mother   . Hyperlipidemia Mother   . Hypertension Father   . Hyperlipidemia Father      History   Social History  . Marital Status: Married    Spouse Name: N/A  . Number of Children: N/A  . Years of Education: N/A   Occupational History  . Not on file.   Social History Main Topics  . Smoking status: Never Smoker   . Smokeless tobacco: Never Used  . Alcohol Use: No  . Drug Use: No  . Sexual Activity: Not on file   Other Topics Concern  . Not on file   Social History Narrative     ROS A 10 point review of system was performed. It is negative other than that mentioned in the history of present illness.   PHYSICAL EXAM   BP 138/82 mmHg  Pulse 89  Ht 5\' 4"  (1.626 m)  Wt 152 lb 8 oz (69.174 kg)  BMI 26.16 kg/m2  LMP 06/22/2014 (Approximate) Constitutional: She is oriented to person, place, and time. She appears well-developed and well-nourished. No distress.  HENT: No nasal discharge.  Head: Normocephalic and atraumatic.  Eyes: Pupils are equal and round. No discharge.  Neck: Normal range of motion. Neck supple. No JVD present. No thyromegaly present.  Cardiovascular: Normal rate, regular rhythm, normal heart sounds. Exam  reveals no gallop and no friction rub. No murmur heard.  Pulmonary/Chest: Effort normal and breath sounds normal. No stridor. No respiratory distress. She has no wheezes. She has no rales. She exhibits no tenderness.  Abdominal: Soft. Bowel sounds are normal. She exhibits no distension. There is no tenderness. There is no rebound and no guarding.  Musculoskeletal: Normal range of motion. She exhibits no edema and no tenderness.  Neurological: She is alert and oriented to person, place, and time. Coordination normal.  Skin: Skin is warm and dry. No rash noted. She is not diaphoretic. No erythema. No pallor.  Psychiatric: She has a normal mood and affect. Her  behavior is normal. Judgment and thought content normal.     YNW:GNFAO  Rhythm  -With sinus arrhythmia  BORDERLINE   ASSESSMENT AND PLAN

## 2014-07-13 NOTE — Assessment & Plan Note (Signed)
Likely due to premature beats. If symptoms worsen, a Holter monitor can be considered after cholecystectomy.

## 2014-07-14 ENCOUNTER — Encounter: Payer: Self-pay | Admitting: General Surgery

## 2014-07-17 ENCOUNTER — Ambulatory Visit (INDEPENDENT_AMBULATORY_CARE_PROVIDER_SITE_OTHER): Payer: BC Managed Care – PPO | Admitting: General Surgery

## 2014-07-17 ENCOUNTER — Encounter: Payer: Self-pay | Admitting: General Surgery

## 2014-07-17 VITALS — BP 116/80 | HR 66 | Resp 12 | Ht 64.0 in | Wt 152.0 lb

## 2014-07-17 DIAGNOSIS — K8020 Calculus of gallbladder without cholecystitis without obstruction: Secondary | ICD-10-CM

## 2014-07-17 NOTE — Patient Instructions (Addendum)
Patient to return as needed. The patient is aware to call back for any questions or concerns. 

## 2014-07-17 NOTE — Progress Notes (Signed)
Patient ID: Stacy Alexander, female   DOB: 03/05/1976, 39 y.o.   MRN: [id number]  Chief Complaint  Patient presents with  . Routine Post Op    cholecystectomy    HPI Stacy Alexander is a 39 y.o. female who presents for a post op cholecystectomy. The procedure was performed on 07/10/14. The patient is doing well. No complaints at this time.   The patient had significant pain in the recovery room post procedure, since that time she has been essentially pain-free. No difficulty with excessive bowel function. No dietary intolerance.  HPI  Past Medical History  Diagnosis Date  . Gallstones 2014  . Arthritis   . Allergy     Past Surgical History  Procedure Laterality Date  . Cesarean section  2010,2013  . Cholecystectomy  07/10/14    Family History  Problem Relation Age of Onset  . Cancer Maternal Uncle     colon  . Hypertension Mother   . Hyperlipidemia Mother   . Hypertension Father   . Hyperlipidemia Father     Social History History  Substance Use Topics  . Smoking status: Never Smoker   . Smokeless tobacco: Never Used  . Alcohol Use: No    No Known Allergies  Current Outpatient Prescriptions  Medication Sig Dispense Refill  . b complex vitamins tablet Take 1 tablet by mouth daily.    . Cholecalciferol (VITAMIN D3) 1000 UNITS CAPS Take 1,000 Units by mouth daily.     . Iron-Vit C-Vit B12-Folic Acid (IRON 100 PLUS PO) Take by mouth.    . Melaton-Thean-Cham-PassF-LBalm (MELATONIN + L-THEANINE PO) Take by mouth.    . Multiple Vitamins-Minerals (MULTIVITAMIN WITH MINERALS) tablet Take 1 tablet by mouth daily.    . Omega-3 Fatty Acids (FISH OIL) 1000 MG CAPS Take 1 capsule by mouth daily.    Marland Kitchen. saccharomyces boulardii (FLORASTOR) 250 MG capsule Take 250 mg by mouth 2 (two) times daily.    Marland Kitchen. venlafaxine XR (EFFEXOR-XR) 75 MG 24 hr capsule Take 1 capsule (75 mg total) by mouth daily with breakfast. 30 capsule 1  . Vitamin D, Ergocalciferol, (DRISDOL) 50000 UNITS CAPS Take  50,000 Units by mouth.     No current facility-administered medications for this visit.    Review of Systems Review of Systems  Constitutional: Negative.   Respiratory: Negative.   Cardiovascular: Negative.   Gastrointestinal: Negative.     Blood pressure 116/80, pulse 66, resp. rate 12, height 5\' 4"  (1.626 m), weight 152 lb (68.947 kg), last menstrual period 06/22/2014.  Physical Exam Physical Exam  Constitutional: She is oriented to person, place, and time. She appears well-developed and well-nourished.  Cardiovascular: Normal rate, regular rhythm and normal heart sounds.   No murmur heard. Pulmonary/Chest: Effort normal and breath sounds normal.  Abdominal: Soft. Normal appearance and bowel sounds are normal. There is no hepatosplenomegaly. There is no tenderness. No hernia.  Some bruising in noted.   Neurological: She is alert and oriented to person, place, and time.  Skin: Skin is warm and dry.    Data Reviewed Pathology: Chronic cholecystitis and cholelithiasis.  Assessment    Doing well status post cholecystectomy.    Plan    Proper lifting technique was demonstrated. She was discouraged from doing abdominal strengthening exercises until April 1.  Follow-up ear will be on an as-needed basis.    PCP:  Staci Acostaullo, Teresa    Jaeden Messer W 07/17/2014, 9:46 AM

## 2014-07-28 ENCOUNTER — Other Ambulatory Visit: Payer: Self-pay | Admitting: Internal Medicine

## 2014-07-29 ENCOUNTER — Telehealth: Payer: Self-pay | Admitting: Internal Medicine

## 2014-07-29 MED ORDER — VENLAFAXINE HCL ER 37.5 MG PO CP24: 75.0000 mg | ORAL_CAPSULE | Freq: Every day | ORAL | Status: DC

## 2014-07-29 NOTE — Telephone Encounter (Signed)
rx for effexor 37.5 mg 2 daily sent to pharmacy

## 2014-07-29 NOTE — Telephone Encounter (Signed)
Patient called and stated was discusssed she is to take 2 Effexor daily of the 37.5 MG instead of the once daily dosing of 75 mg , patient stated she needs new script with new dosing.

## 2014-08-26 NOTE — Op Note (Signed)
PATIENT NAME:  Stacy Alexander, [name] MR#:  [id number] DATE OF BIRTH:  03-11-1976  DATE OF PROCEDURE:  12/29/2011  PREOPERATIVE DIAGNOSIS: Previous cesarean section.   POSTOPERATIVE DIAGNOSIS: Previous cesarean section.   PROCEDURE: LUT repeat cesarean section.   SURGEON: Elliot Gurneyarrie C. Aymara Sassi, M.D.   ASSISTANT: Nedra HaiAndrea Staebler, MD  ESTIMATED BLOOD LOSS: 500 mL.  FINDINGS: Term live-born infant with Apgars 9 and 9.   DESCRIPTION OF PROCEDURE: The patient was taken to the Operating Room and placed in supine position. After adequate spinal epidural anesthesia was instilled, the patient was prepped and draped in the usual sterile fashion. A Pfannenstiel skin incision was made approximately two fingerbreadths above the pubic symphysis and carried sharply down to the fascia. The old incision was cut through. Pfannenstiel skin incision was carried sharply down to the fascia. The fascia was nicked in the midline. The incision was extended in a superolateral manner. Muscle bellies were sharply and bluntly dissected off the rectus fascia. The peritoneum was grasped in the midline, nicked and entered. Bladder blade was placed. A bladder flap was created. Uterine incision was made. Infant was delivered. The infant's cord was then clamped and cut and the infant was handed off to the awaiting pediatrician. The uterus was exteriorized. Pitocin was started. Placenta was removed. The inside of the uterus was curetted with a moist laparotomy sponge. The uterine incision was closed with a running, locked chromic suture and a running imbricating chromic suture. One figure-of-eight suture was placed in the left lower quadrant of the incision due to excessive bleeding. The belly was cleared of clots. The uterus was placed back into the abdomen. Muscle bellies were closed. Muscles were irrigated. The On-Q pain pump system was placed using two trocars in the midline. The fascia was closed with a running     Vicryl suture.  Subcutaneous tissue suture was placed. Subcuticular 4-0 Monocryl was used. Steri-Strips were placed. The On-Q pain pump was primed. The fundus was massaged, clots were seen. The patient was then taken to recovery after having tolerated the procedure well.  ____________________________ Elliot Gurneyarrie C. Khameron Gruenwald, MD cck:slb D: 01/03/2012 14:17:25 ET T: 01/03/2012 14:28:15 ET JOB#: [id number]  cc: Elliot Gurneyarrie C. Anyelina Claycomb, MD, <Dictator> Elliot GurneyARRIE C Gerik Coberly MD ELECTRONICALLY SIGNED 01/03/2012 19:11

## 2014-08-29 NOTE — H&P (Signed)
PATIENT NAME:  Stacy Alexander, Stacy Alexander MR#:  [id number] DATE OF BIRTH:  [birth year]  DATE OF ADMISSION:  06/01/2012  HISTORY: This is a 39 year old, otherwise healthy, white female who presents to the Emergency Room with an approximately 12-hour history of acute onset of right lower quadrant abdominal pain. It began at approximately 8:00 this morning according to the patient, followed by nausea and vomiting x3 which made things a little bit better, a couple of loose stools. The patient also describing some urinary hesitancy as well as some new onset of anal discomfort. The [facility] Dr. Luella Cookosenow saw her as she is recently postpartum back in August 2013 status post repeat cesarean section. Pelvic examination was performed and negative according to the patient. The patient was then sent to the Emergency Room for further evaluation. Urine pregnancy test was negative. The patient denies any dysuria or hematuria and no fever, no sick contacts. She is otherwise healthy. She describes her last menstrual period ending on January 4th. She is no longer breast feeding.   ALLERGIES: None.   MEDICATIONS: None.   PAST MEDICAL HISTORY: None.   PAST SURGICAL HISTORY: Cesarean section x2, most recently in August of 2013.   REVIEW OF SYSTEMS: As described above and as per 10-point review, otherwise unremarkable.   PHYSICAL EXAMINATION:  GENERAL: The patient was examined with the ER nurse present.  VITAL SIGNS: Temperature is 97.9, pulse of 100, blood pressure is 119/67. She is 5 feet 4 and 170 pounds.  GENERAL: She is in no obvious distress.  FACIES: Symmetrical.  NECK: Normal appearing.  LUNGS: Clear.  HEART: Regular rate and rhythm.  ABDOMEN: Soft with mild tenderness in the right lower quadrant more than the left lower quadrant. There is a well-healing Pfannenstiel incision with no obvious cellulitis or skin disruption.  EXTREMITIES: Warm and well perfused with no edema.   NEUROLOGIC AND PSYCHIATRIC: Normal.  RECTAL AND GENITOURINARY: Examination was deferred.   LABORATORY VALUES: Urinalysis is negative. White count is 13.9, hemoglobin 13.2, hematocrit 40.1, platelet count 214,000. Liver function tests are normal except for alkaline phosphatase of 171, creatinine 0.52, sodium 140, potassium 3.9, total calcium 9.3, BUN 10.   Review of CT scan: There is a normal-appearing appendix. I can appreciate no evidence of periappendiceal fat stranding, pneumatosis around the appendix. It is a nonoral contrasted CT scan. Intravenous contrast was given. There is possible left side nephrolithiasis. There is a less than 2 cm right adnexal cyst or follicle with possible free fluid on my interpretation. There is scattered colonic diverticulosis present. No evidence of diverticulitis.   IMPRESSION: A 39 year old white female with abdominal pain at this point, which I do not think is related to appendicitis but more either gastroenteritis or ovarian follicle related as that is only finding seen on CT scan.   PLAN: The patient will be admitted and begun on empiric antibiotics in case this is early appendicitis. Keep NPO, serial abdominal examinations and repeat her CT scan with oral contrast or diagnostic laparoscopy as the clinical scenario develops.   TOTAL TIME SPENT: 45 minutes.    ____________________________ Redge GainerMark A. Egbert GaribaldiBird, MD mab:gb D: 06/01/2012 20:35:18 ET T: 06/01/2012 21:37:42 ET JOB#: [id number]  cc: Loraine LericheMark A. Egbert GaribaldiBird, MD, <Dictator> Deloris PingPhilip J. Luella Cookosenow, MD Raynald KempMARK A Hellena Pridgen MD ELECTRONICALLY SIGNED 06/02/2012 4:00

## 2014-09-01 LAB — SURGICAL PATHOLOGY

## 2014-09-07 NOTE — Op Note (Signed)
PATIENT NAME:  Stacy Alexander, Stacy Alexander DATE OF BIRTH:  Aug 14, 1975  DATE OF PROCEDURE:  07/10/2014  PREOPERATIVE DIAGNOSES: Chronic cholecystitis and cholecystolithiasis.   POSTOPERATIVE DIAGNOSIS: Chronic cholecystitis and cholecystolithiasis.   OPERATIVE PROCEDURE: Laparoscopic cholecystectomy with intraoperative cholangiograms.   SURGEON: Earline MayotteJeffrey W. Taaj Hurlbut, MD   ANESTHESIA: General endotracheal under Dr. Henrene HawkingKephart.   ESTIMATED BLOOD LOSS: Less than 5 mL.   CLINICAL NOTE: This 39 year old with woman has had a long history of symptomatic cholelithiasis. She has elected to undergo cholecystectomy for relief of symptoms.   OPERATIVE NOTE: With the patient under adequate general endotracheal anesthesia, the abdomen was prepped with ChloraPrep and draped. In Trendelenburg position, a Veress needle was positioned through a transumbilical incision, and after assuring intra-abdominal location with the hanging drop test, the abdomen was insufflated with CO2 at 10 mmHg pressure. A 10 mm step port was expanded and inspection showed no evidence of injury from initial port placement. In the reverse Trendelenburg position and rolled to the left, an 11 mm XL port was placed into the epigastric area followed by two 5-mm step ports in the right lateral abdominal wall. Mild filmy adhesions to the body and neck of the gallbladder were noted. Evidence of cholesterolosis appreciated. The gallbladder was placed on cephalad traction. The neck of the gallbladder was cleared and fluoroscopic cholangiograms completed using 4 mL of 1/2 strength Conray 60. This showed prompt filling of the right and left hepatic ducts and free flow into the duodenum. No evidence of retained stones. The cystic duct and single cystic artery were doubly clipped and divided. The gallbladder was removed from the liver bed making use of hook cautery dissection. A small area of bleeding at the fundus of the gallbladder bed was controlled  with the application of Surgicel with good hemostasis noted. The gallbladder was delivered through the umbilical port site. After inspection from the epigastric site showing no evidence of injury from initial port placement, the camera was moved back to the umbilical site and the right upper quadrant inspected. Good hemostasis was noted. The area was irrigated with lactated Ringer's solution. The abdomen was desufflated and ports removed under direct vision. Skin incisions were closed with 4-0 Vicryl subcuticular sutures. Benzoin, Steri-Strips, Telfa, and Tegaderm dressings were applied. The patient tolerated the procedure well and was taken to the recovery room in stable condition.     ____________________________ Earline MayotteJeffrey W. Stacy Finnicum, MD jwb:mw D: 07/10/2014 09:00:06 ET T: 07/10/2014 18:34:37 ET JOB#: [id number]  cc: Earline MayotteJeffrey W. Samarie Pinder, MD, <Dictator> Duncan Dulleresa Tullo, MD Marisel Tostenson Brion AlimentW Mckynna Vanloan MD ELECTRONICALLY SIGNED 07/12/2014 7:40

## 2014-11-23 ENCOUNTER — Other Ambulatory Visit: Payer: Self-pay | Admitting: Internal Medicine

## 2014-12-17 ENCOUNTER — Telehealth: Payer: Self-pay | Admitting: *Deleted

## 2014-12-17 NOTE — Telephone Encounter (Signed)
Patient has gallbladder surgery back in March 2016, she is having some burning in her stomach which also radiates to her back and she is nausea. Bowels have been good. Some chills but no fever.

## 2014-12-18 ENCOUNTER — Ambulatory Visit: Payer: BC Managed Care – PPO | Admitting: General Surgery

## 2015-03-22 ENCOUNTER — Other Ambulatory Visit: Payer: Self-pay | Admitting: Internal Medicine

## 2015-03-23 NOTE — Telephone Encounter (Signed)
Effexor last refilled 11/24/14 for #60 with 3 refills. Last office visit was 07/03/14. Ok to refill this medication?

## 2015-03-24 ENCOUNTER — Other Ambulatory Visit: Payer: Self-pay

## 2015-03-24 NOTE — Telephone Encounter (Signed)
Refill for 30 days only.  OFFICE VISIT NEEDED prior to any more refills 

## 2015-04-13 ENCOUNTER — Ambulatory Visit (INDEPENDENT_AMBULATORY_CARE_PROVIDER_SITE_OTHER): Payer: BC Managed Care – PPO | Admitting: Nurse Practitioner

## 2015-04-13 ENCOUNTER — Encounter: Payer: Self-pay | Admitting: Nurse Practitioner

## 2015-04-13 VITALS — BP 118/78 | HR 100 | Temp 98.3°F | Resp 14 | Ht 64.0 in | Wt 166.4 lb

## 2015-04-13 DIAGNOSIS — J0111 Acute recurrent frontal sinusitis: Secondary | ICD-10-CM | POA: Diagnosis not present

## 2015-04-13 DIAGNOSIS — J019 Acute sinusitis, unspecified: Secondary | ICD-10-CM | POA: Insufficient documentation

## 2015-04-13 MED ORDER — HYDROCOD POLST-CPM POLST ER 10-8 MG/5ML PO SUER: 5.0000 mL | Freq: Every evening | ORAL | Status: DC | PRN

## 2015-04-13 MED ORDER — AMOXICILLIN-POT CLAVULANATE 875-125 MG PO TABS: 1.0000 | ORAL_TABLET | Freq: Two times a day (BID) | ORAL | Status: DC

## 2015-04-13 NOTE — Patient Instructions (Signed)

## 2015-04-13 NOTE — Progress Notes (Signed)
Pre visit review using our clinic review tool, if applicable. No additional management support is needed unless otherwise documented below in the visit note. 

## 2015-04-13 NOTE — Assessment & Plan Note (Addendum)
Patient was last seen in February for acute sinus infection. Due to length of time and worsening symptoms will treat with Augmentin twice daily 7 days and this was sent to pharmacy. Tussionex prescription was printed out signed and given to patient to take to pharmacy. Encouraged her to continue her probiotics for at least 3 weeks.

## 2015-04-13 NOTE — Progress Notes (Signed)
Patient ID: Stacy Alexander, female    DOB: July 16, 1975  Age: 39 y.o. MRN: [id number]  CC: Sinusitis   HPI Stacy Alexander presents for CC of sinusitis x 2 weeks.   1) Pt reports she believes it started with the flu- chills, body aches, sore throat              Currently having Sneezing, coughing-productive, nose bleeds, pressure frontal, ears painful bilateral same, headaches.  Denies tooth pain, pressure increase with bending forward Positive for bad taste in mouth  Treatment to date: Neti-Pot Mucinex- not helpful Zyrtec-D  Cough- suppressant- not helpful Tylenol  Afrin- patient has not used recently Saline nasal spray   Sick Contacts-   Son has been sick   History Stacy Alexander has a past medical history of Gallstones (2014); Arthritis; and Allergy.   She has past surgical history that includes Cesarean section (1478,2956) and Cholecystectomy (07/10/14).   Her family history includes Cancer in her maternal uncle; Hyperlipidemia in her father and mother; Hypertension in her father and mother.She reports that she has never smoked. She has never used smokeless tobacco. She reports that she does not drink alcohol or use illicit drugs.  Outpatient Prescriptions Prior to Visit  Medication Sig Dispense Refill  . b complex vitamins tablet Take 1 tablet by mouth daily.    . Cholecalciferol (VITAMIN D3) 1000 UNITS CAPS Take 1,000 Units by mouth daily.     . Iron-Vit C-Vit B12-Folic Acid (IRON 100 PLUS PO) Take by mouth.    . Melaton-Thean-Cham-PassF-LBalm (MELATONIN + L-THEANINE PO) Take by mouth.    . Multiple Vitamins-Minerals (MULTIVITAMIN WITH MINERALS) tablet Take 1 tablet by mouth daily.    . Omega-3 Fatty Acids (FISH OIL) 1000 MG CAPS Take 1 capsule by mouth daily.    Marland Kitchen saccharomyces boulardii (FLORASTOR) 250 MG capsule Take 250 mg by mouth 2 (two) times daily.    Marland Kitchen venlafaxine XR (EFFEXOR-XR) 37.5 MG 24 hr capsule TAKE 2 CAPSULES(75 MG) BY MOUTH DAILY WITH BREAKFAST 60 capsule 0  .  Vitamin D, Ergocalciferol, (DRISDOL) 50000 UNITS CAPS Take 50,000 Units by mouth.     No facility-administered medications prior to visit.    ROS Review of Systems  Constitutional: Positive for chills and fatigue. Negative for fever and diaphoresis.  HENT: Positive for congestion, ear pain, nosebleeds, postnasal drip, rhinorrhea, sinus pressure and sore throat. Negative for ear discharge.   Eyes: Negative for visual disturbance.  Respiratory: Positive for cough. Negative for chest tightness, shortness of breath and wheezing.   Gastrointestinal: Negative for nausea, vomiting and diarrhea.  Musculoskeletal: Positive for myalgias.  Skin: Negative for rash.  Neurological: Positive for headaches.    Objective:  BP 118/78 mmHg  Pulse 100  Temp(Src) 98.3 F (36.8 C)  Resp 14  Ht  (1.626 m)  Wt 166 lb 6.4 oz (75.479 kg)  BMI 28.55 kg/m2  SpO2 98%  Physical Exam  Constitutional: She is oriented to person, place, and time. She appears well-developed and well-nourished. No distress.  HENT:  Head: Normocephalic and atraumatic.  Right Ear: External ear normal.  Left Ear: External ear normal.  Mouth/Throat: No oropharyngeal exudate.  Thick yellow coat on tongue TMs partially obscured by cerumen, serous fluid seen behind the rest that is visible  Eyes: EOM are normal. Pupils are equal, round, and reactive to light. Right eye exhibits no discharge. Left eye exhibits no discharge. No scleral icterus.  Neck: Normal range of motion. Neck supple.  Cardiovascular: Normal rate,  regular rhythm and normal heart sounds.  Exam reveals no gallop and no friction rub.   No murmur heard. Pulmonary/Chest: Effort normal and breath sounds normal. No respiratory distress. She has no wheezes. She has no rales. She exhibits no tenderness.  Lymphadenopathy:    She has cervical adenopathy.  Neurological: She is alert and oriented to person, place, and time. No cranial nerve deficit. She exhibits normal  muscle tone. Coordination normal.  Skin: Skin is warm and dry. No rash noted. She is not diaphoretic.  Psychiatric: She has a normal mood and affect. Her behavior is normal. Judgment and thought content normal.   Assessment & Plan:   There are no diagnoses linked to this encounter. I am having Stacy Alexander start on amoxicillin-clavulanate and chlorpheniramine-HYDROcodone. I am also having her maintain her Vitamin D (Ergocalciferol), b complex vitamins, Iron-Vit C-Vit B12-Folic Acid (IRON 100 PLUS PO), Melaton-Thean-Cham-PassF-LBalm (MELATONIN + L-THEANINE PO), Vitamin D3, multivitamin with minerals, saccharomyces boulardii, Fish Oil, and venlafaxine XR.  Meds ordered this encounter  Medications  . amoxicillin-clavulanate (AUGMENTIN) 875-125 MG tablet    Sig: Take 1 tablet by mouth 2 (two) times daily.    Dispense:  14 tablet    Refill:  0    Order Specific Question:  Supervising Provider    Answer:  Duncan DullULLO, TERESA L [[id number]]  . chlorpheniramine-HYDROcodone (TUSSIONEX PENNKINETIC ER) 10-8 MG/5ML SUER    Sig: Take 5 mLs by mouth at bedtime as needed for cough.    Dispense:  115 mL    Refill:  0    Order Specific Question:  Supervising Provider    Answer:  Sherlene ShamsULLO, TERESA L [[id number]]     Follow-up: Return if symptoms worsen or fail to improve.

## 2015-04-16 ENCOUNTER — Other Ambulatory Visit: Payer: Self-pay | Admitting: Nurse Practitioner

## 2015-04-16 ENCOUNTER — Telehealth: Payer: Self-pay | Admitting: Internal Medicine

## 2015-04-16 MED ORDER — DOXYCYCLINE HYCLATE 100 MG PO TABS: 100.0000 mg | ORAL_TABLET | Freq: Two times a day (BID) | ORAL | Status: DC

## 2015-04-16 NOTE — Telephone Encounter (Signed)
Please let pt know that I have switched it to Doxycyline 100 mg twice a day for 5 days (sent to pharmacy) Continue to take probiotics and use back up methods when being sexually active as this medication is contraindicated during pregnancy.

## 2015-04-16 NOTE — Telephone Encounter (Signed)
Patient was seen for a sinus infection and was prescribed augmentin due to her having her gallbladder removed she feels it maybe contributing to her having diarrhea, stomach burning, and headache. Please advise she was seen by Naomie Deanarrie Doss. She wondered if she was able to get a alternate medication.

## 2015-04-16 NOTE — Telephone Encounter (Signed)
Left voicemail for patient on the medication.

## 2015-04-29 ENCOUNTER — Other Ambulatory Visit: Payer: Self-pay | Admitting: Internal Medicine

## 2015-04-29 NOTE — Telephone Encounter (Signed)
Pt called back to check the status of her medication venlafaxine XR (EFFEXOR-XR) 37.5 MG 24 hr capsule. [facility] is [facility] [phone] - [place], [place] - [address] Thank You!

## 2015-05-27 ENCOUNTER — Other Ambulatory Visit: Payer: Self-pay | Admitting: Internal Medicine

## 2015-05-27 NOTE — Telephone Encounter (Signed)
Refill for 30 days only.  OFFICE VISIT NEEDED prior to any more refills 

## 2015-05-27 NOTE — Telephone Encounter (Signed)
Last OV with You was 07/03/14 ok to fill?

## 2015-07-02 ENCOUNTER — Other Ambulatory Visit: Payer: Self-pay | Admitting: Internal Medicine

## 2015-07-03 NOTE — Telephone Encounter (Signed)
Refill for 30 days only.  OFFICE VISIT NEEDED prior to any more refills  please give her an appointment

## 2015-07-03 NOTE — Telephone Encounter (Signed)
Last OV with MD 2/16 ok to fill Effexor?

## 2015-07-23 ENCOUNTER — Ambulatory Visit (INDEPENDENT_AMBULATORY_CARE_PROVIDER_SITE_OTHER): Payer: BC Managed Care – PPO | Admitting: Internal Medicine

## 2015-07-23 ENCOUNTER — Encounter: Payer: Self-pay | Admitting: Internal Medicine

## 2015-07-23 VITALS — BP 128/90 | HR 85 | Temp 98.0°F | Resp 12 | Ht 64.0 in | Wt 171.2 lb

## 2015-07-23 DIAGNOSIS — Z113 Encounter for screening for infections with a predominantly sexual mode of transmission: Secondary | ICD-10-CM

## 2015-07-23 DIAGNOSIS — N924 Excessive bleeding in the premenopausal period: Secondary | ICD-10-CM

## 2015-07-23 DIAGNOSIS — F4323 Adjustment disorder with mixed anxiety and depressed mood: Secondary | ICD-10-CM | POA: Diagnosis not present

## 2015-07-23 DIAGNOSIS — L989 Disorder of the skin and subcutaneous tissue, unspecified: Secondary | ICD-10-CM | POA: Diagnosis not present

## 2015-07-23 DIAGNOSIS — E663 Overweight: Secondary | ICD-10-CM

## 2015-07-23 DIAGNOSIS — E785 Hyperlipidemia, unspecified: Secondary | ICD-10-CM

## 2015-07-23 DIAGNOSIS — E559 Vitamin D deficiency, unspecified: Secondary | ICD-10-CM | POA: Diagnosis not present

## 2015-07-23 MED ORDER — VENLAFAXINE HCL ER 150 MG PO CP24: 150.0000 mg | ORAL_CAPSULE | Freq: Every day | ORAL | Status: DC

## 2015-07-23 NOTE — Patient Instructions (Addendum)
I have increased your Effexor to 150 mg daily to help you handle your stress.  Here are the names of several well respected female therapistsin the area:   Montel ClockKatherine Wagner    [phone]  Corinne Karen [place]   [phone]  [place] Valerie Padgett [phone]  [place] Jane Perrin  [phone]  [place]  Don't worry about your weight right now!  Just concentrate on a healthy diet    Premier  Protein shakes  160 cal 1 net carb    30 g protein   Avoid granola; it's loaded with carbs   To make a low carb chip :  Take the Joseph's Lavash or Pita bread,  Or the [place] Low carb whole wheat tortilla   Place on metal cookie sheet  Brush with olive oil  Sprinkle garlic powder (NOT garlic salt), grated parmesan cheese, mediterranean seasoning , or all of them?  Bake at 225 or 250 for 90 minutes   We have substitutions for your potatoes!!  Try the mashed cauliflower and riced cauliflower dishes instead of rice and mashed potatoes  Mashed turnips are also very low carb!   For dessert:  Try the Dannon Lt n Fit greek yogurt dessert flavors and top with reddi Whip .  8 carbs,  80 calories  Try Oikos Triple Zero [place] Yogurt in the salted caramel, and the coffee flavors  With Whipped Cream for dessert

## 2015-07-23 NOTE — Progress Notes (Signed)
Pre-visit discussion using our clinic review tool. No additional management support is needed unless otherwise documented below in the visit note.  

## 2015-07-23 NOTE — Progress Notes (Signed)
Subjective:  Patient ID: Stacy MormonFrances R Alexander, female    DOB: 03/12/1976  Age: 40 y.o. MRN: [id number]  CC: The primary encounter diagnosis was Adjustment disorder with mixed anxiety and depressed mood. Diagnoses of Menorrhagia, premenopausal, Skin lesion, Premenopausal menorrhagia, Vitamin D deficiency, Screen for STD (sexually transmitted disease), Hyperlipidemia, and Overweight (BMI 25.0-29.9) were also pertinent to this visit.  HPI Stacy MormonFrances R Filip presents for follow up on GAD and overweight   She is tearful today,  Reports that she is Under a lot of stress.  The emotional stressors include health issues of her son, which she is unwilling to discuss.  She has been taking Effexor but does not feel it is helping at the current dose. Discussed increasing dose to 150 mg .   Fatigue.  Menses are heavy.  She is avoiding chemical or physical contraception and using rhythm method .  Taking iron   Needs to return for fasting labs  Wants to see dermatologist  for follow up on precancerous mole.  Prefers a female dermatologist  Outpatient Prescriptions Prior to Visit  Medication Sig Dispense Refill  . b complex vitamins tablet Take 1 tablet by mouth daily.    . Cholecalciferol (VITAMIN D3) 1000 UNITS CAPS Take 1,000 Units by mouth daily.     . Iron-Vit C-Vit B12-Folic Acid (IRON 100 PLUS PO) Take by mouth.    . Melaton-Thean-Cham-PassF-LBalm (MELATONIN + L-THEANINE PO) Take by mouth.    . Multiple Vitamins-Minerals (MULTIVITAMIN WITH MINERALS) tablet Take 1 tablet by mouth daily.    . Omega-3 Fatty Acids (FISH OIL) 1000 MG CAPS Take 1 capsule by mouth daily.    Marland Kitchen. venlafaxine XR (EFFEXOR-XR) 37.5 MG 24 hr capsule TAKE 2 CAPSULES BY MOUTH EVERY DAY WITH BREAKFAST 60 capsule 0  . saccharomyces boulardii (FLORASTOR) 250 MG capsule Take 250 mg by mouth 2 (two) times daily. Reported on 07/23/2015    . Vitamin D, Ergocalciferol, (DRISDOL) 50000 UNITS CAPS Take 50,000 Units by mouth. Reported on 07/23/2015      . chlorpheniramine-HYDROcodone (TUSSIONEX PENNKINETIC ER) 10-8 MG/5ML SUER Take 5 mLs by mouth at bedtime as needed for cough. (Patient not taking: Reported on 07/23/2015) 115 mL 0  . doxycycline (VIBRA-TABS) 100 MG tablet Take 1 tablet (100 mg total) by mouth 2 (two) times daily. (Patient not taking: Reported on 07/23/2015) 10 tablet 0   No facility-administered medications prior to visit.    Review of Systems;  Patient denies headache, fevers, malaise, unintentional weight loss, skin rash, eye pain, sinus congestion and sinus pain, sore throat, dysphagia,  hemoptysis , cough, dyspnea, wheezing, chest pain, palpitations, orthopnea, edema, abdominal pain, nausea, melena, diarrhea, constipation, flank pain, dysuria, hematuria, urinary  Frequency, nocturia, numbness, tingling, seizures,  Focal weakness, Loss of consciousness,  Tremor, insomnia, depression, anxiety, and suicidal ideation.      Objective:  BP 128/90 mmHg  Pulse 85  Temp(Src) 98 F (36.7 C) (Oral)  Resp 12  Ht 5\' 4"  (1.626 m)  Wt 171 lb 4 oz (77.678 kg)  BMI 29.38 kg/m2  SpO2 99%  LMP 06/16/2015  BP Readings from Last 3 Encounters:  07/23/15 128/90  04/13/15 118/78  07/17/14 116/80    Wt Readings from Last 3 Encounters:  07/23/15 171 lb 4 oz (77.678 kg)  04/13/15 166 lb 6.4 oz (75.479 kg)  07/17/14 152 lb (68.947 kg)    General appearance: alert, cooperative and appears stated age Ears: normal TM's and external ear canals both ears Throat: lips, mucosa, and  tongue normal; teeth and gums normal Neck: no adenopathy, no carotid bruit, supple, symmetrical, trachea midline and thyroid not enlarged, symmetric, no tenderness/mass/nodules Back: symmetric, no curvature. ROM normal. No CVA tenderness. Lungs: clear to auscultation bilaterally Heart: regular rate and rhythm, S1, S2 normal, no murmur, click, rub or gallop Abdomen: soft, non-tender; bowel sounds normal; no masses,  no organomegaly Pulses: 2+ and  symmetric Skin: Skin color, texture, turgor normal. No rashes or lesions Lymph nodes: Cervical, supraclavicular, and axillary nodes normal. Psych: affect anxious,  makes good eye contact. No fidgeting,  Tearful, but Smiles easily.  Denies suicidal thoughts   No results found for: HGBA1C  Lab Results  Component Value Date   CREATININE 0.5 03/28/2014   CREATININE 0.52* 06/01/2012    Lab Results  Component Value Date   WBC 7.7 03/28/2014   HGB 13.7 03/28/2014   HCT 41.0 03/28/2014   PLT 156.0 03/28/2014   GLUCOSE 82 03/28/2014   CHOL 154 03/28/2014   TRIG 38.0 03/28/2014   HDL 51.00 03/28/2014   LDLCALC 95 03/28/2014   ALT 27 03/28/2014   AST 32 03/28/2014   NA 139 03/28/2014   K 3.7 03/28/2014   CL 106 03/28/2014   CREATININE 0.5 03/28/2014   BUN 11 03/28/2014   CO2 26 03/28/2014   TSH 0.99 03/28/2014       Assessment & Plan:   Problem List Items Addressed This Visit    Adjustment disorder with mixed anxiety and depressed mood - Primary    Tolerating change to Effexor,  But wants to increase the dose.  Discussed her concerns about birth defects should she conceive.        Menorrhagia, premenopausal    Taking iron.  Will check CBC and iron stores with next lab      Skin lesion    She reports a history of precancerous mole and is requesting referral to female dermatologist.       Relevant Orders   Ambulatory referral to Dermatology   Overweight (BMI 25.0-29.9)    I have addressed  BMI and recommended a low glycemic index diet utilizing smaller more frequent meals to increase metabolism.  I have also recommended that patient start exercising with a goal of 30 minutes of aerobic exercise a minimum of 5 days per week. Screening for lipid disorders, thyroid and diabetes to be done        Other Visit Diagnoses    Premenopausal menorrhagia        Relevant Orders    Comprehensive metabolic panel    TSH    CBC with Differential/Platelet    Vitamin D deficiency         Relevant Orders    VITAMIN D 25 Hydroxy (Vit-D Deficiency, Fractures)    Screen for STD (sexually transmitted disease)        Relevant Orders    Hepatitis C antibody    HIV antibody    Hyperlipidemia        Relevant Orders    Lipid panel     A total of 25 minutes of face to face time was spent with patient more than half of which was spent in counselling about the above mentioned conditions  and coordination of care   I have discontinued Ms. Vales's chlorpheniramine-HYDROcodone and doxycycline. I have also changed her venlafaxine XR. Additionally, I am having her maintain her Vitamin D (Ergocalciferol), b complex vitamins, Iron-Vit C-Vit B12-Folic Acid (IRON 100 PLUS PO), Melaton-Thean-Cham-PassF-LBalm (MELATONIN + L-THEANINE PO), Vitamin  D3, multivitamin with minerals, saccharomyces boulardii, and Fish Oil.  Meds ordered this encounter  Medications  . venlafaxine XR (EFFEXOR-XR) 150 MG 24 hr capsule    Sig: Take 1 capsule (150 mg total) by mouth daily with breakfast.    Dispense:  30 capsule    Refill:  0    Medications Discontinued During This Encounter  Medication Reason  . venlafaxine XR (EFFEXOR-XR) 37.5 MG 24 hr capsule Reorder  . doxycycline (VIBRA-TABS) 100 MG tablet   . chlorpheniramine-HYDROcodone (TUSSIONEX PENNKINETIC ER) 10-8 MG/5ML SUER     Follow-up: No Follow-up on file.   Sherlene Shams, MD

## 2015-07-26 DIAGNOSIS — E663 Overweight: Secondary | ICD-10-CM | POA: Insufficient documentation

## 2015-07-26 DIAGNOSIS — L989 Disorder of the skin and subcutaneous tissue, unspecified: Secondary | ICD-10-CM | POA: Insufficient documentation

## 2015-07-26 NOTE — Assessment & Plan Note (Signed)
Taking iron.  Will check CBC and iron stores with next lab

## 2015-07-26 NOTE — Assessment & Plan Note (Signed)
She reports a history of precancerous mole and is requesting referral to female dermatologist.

## 2015-07-26 NOTE — Assessment & Plan Note (Signed)
I have addressed  BMI and recommended a low glycemic index diet utilizing smaller more frequent meals to increase metabolism.  I have also recommended that patient start exercising with a goal of 30 minutes of aerobic exercise a minimum of 5 days per week. Screening for lipid disorders, thyroid and diabetes to be done   

## 2015-07-26 NOTE — Assessment & Plan Note (Signed)
Tolerating change to Effexor,  But wants to increase the dose.  Discussed her concerns about birth defects should she conceive.

## 2015-07-27 ENCOUNTER — Encounter: Payer: Self-pay | Admitting: Internal Medicine

## 2015-08-12 ENCOUNTER — Encounter: Payer: Self-pay | Admitting: Family Medicine

## 2015-08-12 ENCOUNTER — Ambulatory Visit (INDEPENDENT_AMBULATORY_CARE_PROVIDER_SITE_OTHER): Payer: BC Managed Care – PPO | Admitting: Family Medicine

## 2015-08-12 VITALS — BP 126/74 | HR 88 | Temp 98.1°F | Ht 64.0 in | Wt 170.6 lb

## 2015-08-12 DIAGNOSIS — J0101 Acute recurrent maxillary sinusitis: Secondary | ICD-10-CM

## 2015-08-12 MED ORDER — FLUTICASONE PROPIONATE 50 MCG/ACT NA SUSP: 2.0000 | Freq: Every day | NASAL | Status: AC

## 2015-08-12 MED ORDER — BENZONATATE 200 MG PO CAPS: 200.0000 mg | ORAL_CAPSULE | Freq: Two times a day (BID) | ORAL | Status: DC | PRN

## 2015-08-12 MED ORDER — AMOXICILLIN-POT CLAVULANATE 875-125 MG PO TABS: 1.0000 | ORAL_TABLET | Freq: Two times a day (BID) | ORAL | Status: DC

## 2015-08-12 NOTE — Progress Notes (Signed)
Patient ID: Nigel MormonFrances R Law, female   DOB: 05/05/1976, 40 y.o.   MRN: [id number]  Marikay AlarEric Loree Shehata, MD Phone: [phone]  Aldean AstFrances R Bernerd PhoFraune is a 40 y.o. female who presents today for same-day visit.  Sinus infection: Patient notes for the last month she is intermittently had maxillary sinus pressure and congestion that will improve for a little while and then get worse. She notes cough productive of dark sputum. Blowing dark mucus out of her nose. Teeth hurt. No fevers. Had influenza on March 21. Some postnasal drip. No ear discomfort. No shortness of breath. She has an appointment scheduled with ENT in several weeks. She's been using Zyrtec-D and Mucinex D.  PMH: nonsmoker.   ROSSee history of present illness   Objective  Physical Exam Filed Vitals:   08/12/15 0859  BP: 126/74  Pulse: 88  Temp: 98.1 F (36.7 C)    BP Readings from Last 3 Encounters:  08/12/15 126/74  07/23/15 128/90  04/13/15 118/78   Wt Readings from Last 3 Encounters:  08/12/15 170 lb 9.6 oz (77.384 kg)  07/23/15 171 lb 4 oz (77.678 kg)  04/13/15 166 lb 6.4 oz (75.479 kg)    Physical Exam  Constitutional: She is well-developed, well-nourished, and in no distress.  HENT:  Head: Normocephalic and atraumatic.  Right Ear: External ear normal.  Left Ear: External ear normal.  Mouth/Throat: No oropharyngeal exudate.  Postnasal drip noted, normal TMs bilaterally, left medial nare with small scab noted  Eyes: Conjunctivae are normal. Pupils are equal, round, and reactive to light.  Neck: Neck supple.  Cardiovascular: Normal rate, regular rhythm and normal heart sounds.  Exam reveals no gallop and no friction rub.   No murmur heard. Pulmonary/Chest: Effort normal and breath sounds normal. No respiratory distress. She has no wheezes. She has no rales.  Lymphadenopathy:    She has no cervical adenopathy.  Neurological: She is alert. Gait normal.  Skin: Skin is warm and dry. She is not diaphoretic.      Assessment/Plan: Please see individual problem list.  Recurrent maxillary sinusitis Patient with recurrent sinusitis. This has been a recurrent issue several times a year for most of her life. Vital signs are stable. Benign lung exam. We will treat with Augmentin. She will take a probiotic while on this. We will add Flonase. Tessalon for cough. She'll keep her appointment with ENT as further evaluation of this recurrent issue would be beneficial. She's given return precautions.    No orders of the defined types were placed in this encounter.    Meds ordered this encounter  Medications  . amoxicillin-clavulanate (AUGMENTIN) 875-125 MG tablet    Sig: Take 1 tablet by mouth 2 (two) times daily.    Dispense:  14 tablet    Refill:  0  . fluticasone (FLONASE) 50 MCG/ACT nasal spray    Sig: Place 2 sprays into both nostrils daily.    Dispense:  16 g    Refill:  6  . benzonatate (TESSALON) 200 MG capsule    Sig: Take 1 capsule (200 mg total) by mouth 2 (two) times daily as needed for cough.    Dispense:  20 capsule    Refill:  0    Marikay AlarEric David Towson, MD [facility]

## 2015-08-12 NOTE — Progress Notes (Signed)
Pre visit review using our clinic review tool, if applicable. No additional management support is needed unless otherwise documented below in the visit note. 

## 2015-08-12 NOTE — Assessment & Plan Note (Addendum)
Patient with recurrent sinusitis. This has been a recurrent issue several times a year for most of her life. Vital signs are stable. Benign lung exam. We will treat with Augmentin. She will take a probiotic while on this. We will add Flonase. Tessalon for cough. She'll keep her appointment with ENT as further evaluation of this recurrent issue would be beneficial. She's given return precautions.

## 2015-08-12 NOTE — Patient Instructions (Signed)
Nice to meet you. You likely have a sinus infection. We will treat with Augmentin. You should take a probiotic while on this. We will start you on Flonase as well. You can use Tessalon for cough. Please keep your appointment with the ENT physician. If you develop as of breath, cough productive of blood, fevers, or any new or changing symptoms please seek medical attention.

## 2015-08-15 ENCOUNTER — Other Ambulatory Visit: Payer: Self-pay | Admitting: Internal Medicine

## 2015-09-01 ENCOUNTER — Other Ambulatory Visit: Payer: Self-pay | Admitting: Internal Medicine

## 2015-09-01 NOTE — Telephone Encounter (Signed)
Pt called in about wanting a 60 or 90 day supply for the medication below. Call pt @ [phone]. Thank you!

## 2015-09-15 ENCOUNTER — Ambulatory Visit: Payer: BC Managed Care – PPO | Admitting: Nurse Practitioner

## 2015-09-15 ENCOUNTER — Ambulatory Visit (INDEPENDENT_AMBULATORY_CARE_PROVIDER_SITE_OTHER): Payer: BC Managed Care – PPO | Admitting: Family

## 2015-09-15 ENCOUNTER — Encounter: Payer: Self-pay | Admitting: Family

## 2015-09-15 VITALS — BP 114/82 | HR 92 | Temp 98.6°F | Ht 64.0 in | Wt 175.4 lb

## 2015-09-15 DIAGNOSIS — J029 Acute pharyngitis, unspecified: Secondary | ICD-10-CM

## 2015-09-15 LAB — POCT RAPID STREP A (OFFICE): Rapid Strep A Screen: NEGATIVE

## 2015-09-15 MED ORDER — LIDOCAINE VISCOUS 2 % MT SOLN: 15.0000 mL | OROMUCOSAL | Status: DC | PRN

## 2015-09-15 MED ORDER — AZITHROMYCIN 250 MG PO TABS: ORAL_TABLET | ORAL | Status: DC

## 2015-09-15 MED ORDER — AMOXICILLIN 500 MG PO CAPS: 500.0000 mg | ORAL_CAPSULE | Freq: Two times a day (BID) | ORAL | Status: DC

## 2015-09-15 NOTE — Progress Notes (Signed)
Pre visit review using our clinic review tool, if applicable. No additional management support is needed unless otherwise documented below in the visit note. 

## 2015-09-15 NOTE — Patient Instructions (Addendum)
If there is no improvement in your symptoms, or if there is any worsening of symptoms, or if you have any additional concerns, please return for re-evaluation; or, if we are closed, consider going to the Emergency Room for evaluation if symptoms urgent.   Strep Throat Strep throat is a bacterial infection of the throat. Your health care provider may call the infection tonsillitis or pharyngitis, depending on whether there is swelling in the tonsils or at the back of the throat. Strep throat is most common during the cold months of the year in children who are 385-38 years of age, but it can happen during any season in people of any age. This infection is spread from person to person (contagious) through coughing, sneezing, or close contact. CAUSES Strep throat is caused by the bacteria called Streptococcus pyogenes. RISK FACTORS This condition is more likely to develop in:  People who spend time in crowded places where the infection can spread easily.  People who have close contact with someone who has strep throat. SYMPTOMS Symptoms of this condition include:  Fever or chills.   Redness, swelling, or pain in the tonsils or throat.  Pain or difficulty when swallowing.  White or yellow spots on the tonsils or throat.  Swollen, tender glands in the neck or under the jaw.  Red rash all over the body (rare). DIAGNOSIS This condition is diagnosed by performing a rapid strep test or by taking a swab of your throat (throat culture test). Results from a rapid strep test are usually ready in a few minutes, but throat culture test results are available after one or two days. TREATMENT This condition is treated with antibiotic medicine. HOME CARE INSTRUCTIONS Medicines  Take over-the-counter and prescription medicines only as told by your health care provider.  Take your antibiotic as told by your health care provider. Do not stop taking the antibiotic even if you start to feel  better.  Have family members who also have a sore throat or fever tested for strep throat. They may need antibiotics if they have the strep infection. Eating and Drinking  Do not share food, drinking cups, or personal items that could cause the infection to spread to other people.  If swallowing is difficult, try eating soft foods until your sore throat feels better.  Drink enough fluid to keep your urine clear or pale yellow. General Instructions  Gargle with a salt-water mixture 3-4 times per day or as needed. To make a salt-water mixture, completely dissolve -1 tsp of salt in 1 cup of warm water.  Make sure that all household members wash their hands well.  Get plenty of rest.  Stay home from school or work until you have been taking antibiotics for 24 hours.  Keep all follow-up visits as told by your health care provider. This is important. SEEK MEDICAL CARE IF:  The glands in your neck continue to get bigger.  You develop a rash, cough, or earache.  You cough up a thick liquid that is green, yellow-brown, or bloody.  You have pain or discomfort that does not get better with medicine.  Your problems seem to be getting worse rather than better.  You have a fever. SEEK IMMEDIATE MEDICAL CARE IF:  You have new symptoms, such as vomiting, severe headache, stiff or painful neck, chest pain, or shortness of breath.  You have severe throat pain, drooling, or changes in your voice.  You have swelling of the neck, or the skin on the neck  neck becomes red and tender.  You have signs of dehydration, such as fatigue, dry mouth, and decreased urination.  You become increasingly sleepy, or you cannot wake up completely.  Your joints become red or painful.   This information is not intended to replace advice given to you by your health care provider. Make sure you discuss any questions you have with your health care provider.   Document Released: 04/22/2000 Document Revised:  01/14/2015 Document Reviewed: 08/18/2014 Elsevier Interactive Patient Education 2016 [employer]  

## 2015-09-15 NOTE — Progress Notes (Signed)
Subjective:    Patient ID: Stacy MormonFrances R Pinnix, female    DOB: 08/28/1975, [age] y.o.   MRN: [id number]   Stacy MormonFrances R Gladhill is a 40 y.o. female who presents today for an acute visit.    HPI Comments: Both sons diagnosed with strep this week. Has been following with allergist. Would like to see ENT due to reoccurrence of sinus infections. Last one 08/2015 and treated with augmentin.    Sore Throat  This is a new problem. The current episode started in the past 7 days. The problem has been unchanged. The pain is worse on the left side. There has been no fever. The pain is mild. Associated symptoms include congestion, ear pain and headaches (resolved). Pertinent negatives include no coughing, ear discharge, hoarse voice, neck pain, shortness of breath, trouble swallowing or vomiting. She has had exposure to strep. She has tried nothing for the symptoms. The treatment provided no relief.   Past Medical History  Diagnosis Date  . Gallstones 2014  . Arthritis   . Allergy    Allergies: Review of patient's allergies indicates no known allergies. Current Outpatient Prescriptions on File Prior to Visit  Medication Sig Dispense Refill  . b complex vitamins tablet Take 1 tablet by mouth daily.    . Cholecalciferol (VITAMIN D3) 1000 UNITS CAPS Take 1,000 Units by mouth daily.     . fluticasone (FLONASE) 50 MCG/ACT nasal spray Place 2 sprays into both nostrils daily. 16 g 6  . Iron-Vit C-Vit B12-Folic Acid (IRON 100 PLUS PO) Take by mouth.    . Melaton-Thean-Cham-PassF-LBalm (MELATONIN + L-THEANINE PO) Take by mouth.    . Multiple Vitamins-Minerals (MULTIVITAMIN WITH MINERALS) tablet Take 1 tablet by mouth daily.    . Omega-3 Fatty Acids (FISH OIL) 1000 MG CAPS Take 1 capsule by mouth daily.    Marland Kitchen. saccharomyces boulardii (FLORASTOR) 250 MG capsule Take 250 mg by mouth 2 (two) times daily. Reported on 07/23/2015    . venlafaxine XR (EFFEXOR-XR) 150 MG 24 hr capsule TAKE 1 CAPSULE(150 MG) BY MOUTH DAILY WITH  BREAKFAST 90 capsule 0  . venlafaxine XR (EFFEXOR-XR) 37.5 MG 24 hr capsule TAKE 2 CAPSULES BY MOUTH EVERY DAY WITH BREAKFAST 60 capsule 2  . Vitamin D, Ergocalciferol, (DRISDOL) 50000 UNITS CAPS Take 50,000 Units by mouth. Reported on 07/23/2015    . amoxicillin-clavulanate (AUGMENTIN) 875-125 MG tablet Take 1 tablet by mouth 2 (two) times daily. (Patient not taking: Reported on 09/15/2015) 14 tablet 0  . benzonatate (TESSALON) 200 MG capsule Take 1 capsule (200 mg total) by mouth 2 (two) times daily as needed for cough. (Patient not taking: Reported on 09/15/2015) 20 capsule 0   No current facility-administered medications on file prior to visit.    Social History  Substance Use Topics  . Smoking status: Never Smoker   . Smokeless tobacco: Never Used  . Alcohol Use: No    Review of Systems  Constitutional: Negative for fever and chills.  HENT: Positive for congestion, ear pain, sinus pressure and sore throat. Negative for ear discharge, hoarse voice and trouble swallowing.   Respiratory: Negative for cough, shortness of breath and wheezing.   Cardiovascular: Negative for chest pain and palpitations.  Gastrointestinal: Negative for nausea and vomiting.  Musculoskeletal: Negative for neck pain.  Neurological: Positive for headaches (resolved).      Objective:    BP 114/82 mmHg  Pulse 92  Temp(Src) 98.6 F (37 C) (Oral)  Ht 5\' 4"  (1.626 m)  Wt 175  lb 6 oz (79.55 kg)  BMI 30.09 kg/m2  SpO2 99%   Physical Exam  Constitutional: She appears well-developed and well-nourished.  HENT:  Head: Normocephalic and atraumatic.  Right Ear: Hearing, tympanic membrane, external ear and ear canal normal. No drainage, swelling or tenderness. No foreign bodies. Tympanic membrane is not erythematous and not bulging. No middle ear effusion. No decreased hearing is noted.  Left Ear: Hearing, tympanic membrane, external ear and ear canal normal. No drainage, swelling or tenderness. No foreign bodies.  Tympanic membrane is not erythematous and not bulging.  No middle ear effusion. No decreased hearing is noted.  Nose: Nose normal. No rhinorrhea. Right sinus exhibits no maxillary sinus tenderness and no frontal sinus tenderness. Left sinus exhibits no maxillary sinus tenderness and no frontal sinus tenderness.  Mouth/Throat: Uvula is midline and mucous membranes are normal. Posterior oropharyngeal erythema present. No oropharyngeal exudate, posterior oropharyngeal edema or tonsillar abscesses.  Tonsils +1 /4.   Eyes: Conjunctivae are normal.  Cardiovascular: Regular rhythm, normal heart sounds and normal pulses.   Pulmonary/Chest: Effort normal and breath sounds normal. She has no wheezes. She has no rhonchi. She has no rales.  Lymphadenopathy:       Head (right side): No submental, no submandibular, no tonsillar, no preauricular, no posterior auricular and no occipital adenopathy present.       Head (left side): No submental, no submandibular, no tonsillar, no preauricular, no posterior auricular and no occipital adenopathy present.    She has no cervical adenopathy.  Neurological: She is alert.  Skin: Skin is warm and dry.  Psychiatric: She has a normal mood and affect. Her speech is normal and behavior is normal. Thought content normal.  Vitals reviewed.      Assessment & Plan:   1. Sore throat Patient and I decided to treat empirically for strep pharyngitis due to severity of symptoms and close exposure to children with strep.  Pending culture.    - POCT rapid strep A- Negative - Culture, Group A Strep - Ambulatory referral to ENT - lidocaine (XYLOCAINE) 2 % solution; Use as directed 15 mLs in the mouth or throat every 3 (three) hours as needed for mouth pain (gargle; may spit or swallow).  Dispense: 100 mL; Refill: 0 - azithromycin (ZITHROMAX) 250 MG tablet; Tale 500 mg PO on day 1, then 250 mg PO q24h x 4 days.  Dispense: 6 tablet; Refill: 0   I am having Ms. Bernerd Pho maintain  her Vitamin D (Ergocalciferol), b complex vitamins, Iron-Vit C-Vit B12-Folic Acid (IRON 100 PLUS PO), Melaton-Thean-Cham-PassF-LBalm (MELATONIN + L-THEANINE PO), Vitamin D3, multivitamin with minerals, saccharomyces boulardii, Fish Oil, amoxicillin-clavulanate, fluticasone, benzonatate, venlafaxine XR, and venlafaxine XR.   No orders of the defined types were placed in this encounter.     Start medications as prescribed and explained to patient on After Visit Summary ( AVS). Risks, benefits, and alternatives of the medications and treatment plan prescribed today were discussed, and patient expressed understanding.   Education regarding symptom management and diagnosis given to patient.   Follow-up:Plan follow-up as discussed or as needed if any worsening symptoms or change in condition.   Continue to follow with Sherlene Shams, MD for routine health maintenance.   Stacy Alexander and I agreed with plan.   Rennie Plowman, FNP

## 2015-09-17 LAB — CULTURE, GROUP A STREP: ORGANISM ID, BACTERIA: NORMAL

## 2015-11-24 ENCOUNTER — Other Ambulatory Visit: Payer: Self-pay | Admitting: Internal Medicine

## 2016-02-09 ENCOUNTER — Ambulatory Visit: Payer: BC Managed Care – PPO | Admitting: Internal Medicine

## 2016-02-16 ENCOUNTER — Ambulatory Visit: Payer: BC Managed Care – PPO | Admitting: Family

## 2016-02-23 ENCOUNTER — Telehealth: Payer: Self-pay | Admitting: Internal Medicine

## 2016-02-23 NOTE — Telephone Encounter (Signed)
Patient Menses was to start the week of 02/12/16,  Patient only had 2 days of spotting after intercourse, just few spots " not enough to wear a pad". Patient has taken to home pregnancy test both negative first being the week of the 6th and on e this past week. Patient and spouse are not using birthcontrol. Patient last full cycle started on 01/16/16.  Last pap was 07/03/14 " normal" done in clinic, patient would pregnancy test and to see if PAP in needed.Other symptoms are breast tenderness and twinges Patient has an appointment scheduled for 02/24/16 @ 1100 with NP here in office. FYI

## 2016-02-24 ENCOUNTER — Ambulatory Visit: Payer: BC Managed Care – PPO | Admitting: Family

## 2016-03-01 ENCOUNTER — Ambulatory Visit (INDEPENDENT_AMBULATORY_CARE_PROVIDER_SITE_OTHER): Payer: BC Managed Care – PPO | Admitting: Family

## 2016-03-01 ENCOUNTER — Encounter: Payer: Self-pay | Admitting: Family

## 2016-03-01 VITALS — BP 120/80 | HR 77 | Temp 97.6°F | Ht 64.0 in | Wt 178.8 lb

## 2016-03-01 DIAGNOSIS — F411 Generalized anxiety disorder: Secondary | ICD-10-CM | POA: Insufficient documentation

## 2016-03-01 MED ORDER — CITALOPRAM HYDROBROMIDE 10 MG PO TABS: 10.0000 mg | ORAL_TABLET | Freq: Every day | ORAL | 0 refills | Status: DC

## 2016-03-01 MED ORDER — VENLAFAXINE HCL ER 75 MG PO CP24: 75.0000 mg | ORAL_CAPSULE | Freq: Every day | ORAL | 0 refills | Status: DC

## 2016-03-01 MED ORDER — VENLAFAXINE HCL ER 37.5 MG PO CP24: 37.5000 mg | ORAL_CAPSULE | Freq: Every day | ORAL | 0 refills | Status: DC

## 2016-03-01 NOTE — Progress Notes (Signed)
Subjective:    Patient ID: Stacy Alexander, female    DOB: 01/15/1976, 40 y.o.   MRN: [id number]  CC: Stacy Alexander is a 40 y.o. female who presents today for follow up.   HPI: Patient here for follow-up to discuss anxiety. She is currently on Effexor which is not working well. Zoloft in past which didn't work well.    Increasing stress at home. No thoughts of hurting herself or anyone else. Concern for weight gain and lack of libido on Effexor. Had period this past week and negative pregnancy test. Feeling sense of sadness as she gets older and is aware of her biological clock. Unsure if she would have another child and worries about health of a potential child. Using pulling out method. No OCP. No concern for pregnancy today since she has a her menstrual period.  Prefers to treat anxiety with medication.     HISTORY:  Past Medical History:  Diagnosis Date  . Allergy   . Arthritis   . Gallstones 2014   Past Surgical History:  Procedure Laterality Date  . CESAREAN SECTION  2010,2013  . CHOLECYSTECTOMY  07/10/14   Family History  Problem Relation Age of Onset  . Cancer Maternal Uncle     colon  . Hypertension Mother   . Hyperlipidemia Mother   . Hypertension Father   . Hyperlipidemia Father     Allergies: Review of patient's allergies indicates no known allergies. Current Outpatient Prescriptions on File Prior to Visit  Medication Sig Dispense Refill  . b complex vitamins tablet Take 1 tablet by mouth daily.    . Cholecalciferol (VITAMIN D3) 1000 UNITS CAPS Take 1,000 Units by mouth daily.     . fluticasone (FLONASE) 50 MCG/ACT nasal spray Place 2 sprays into both nostrils daily. 16 g 6  . Iron-Vit C-Vit B12-Folic Acid (IRON 100 PLUS PO) Take by mouth.    . Melaton-Thean-Cham-PassF-LBalm (MELATONIN + L-THEANINE PO) Take by mouth.    . Multiple Vitamins-Minerals (MULTIVITAMIN WITH MINERALS) tablet Take 1 tablet by mouth daily.    . Omega-3 Fatty Acids (FISH OIL) 1000 MG  CAPS Take 1 capsule by mouth daily.    Marland Kitchen. saccharomyces boulardii (FLORASTOR) 250 MG capsule Take 250 mg by mouth 2 (two) times daily. Reported on 07/23/2015    . Vitamin D, Ergocalciferol, (DRISDOL) 50000 UNITS CAPS Take 50,000 Units by mouth. Reported on 07/23/2015     No current facility-administered medications on file prior to visit.     Social History  Substance Use Topics  . Smoking status: Never Smoker  . Smokeless tobacco: Never Used  . Alcohol use No    Review of Systems  Constitutional: Negative for chills and fever.  Respiratory: Negative for cough.   Cardiovascular: Negative for chest pain and palpitations.  Gastrointestinal: Negative for nausea and vomiting.  Psychiatric/Behavioral: Negative for sleep disturbance and suicidal ideas. The patient is nervous/anxious.       Objective:    BP 120/80   Pulse 77   Temp 97.6 F (36.4 C) (Oral)   Ht 5\' 4"  (1.626 m)   Wt 178 lb 12.8 oz (81.1 kg)   LMP 02/27/2016 (Exact Date)   SpO2 98%   BMI 30.69 kg/m  BP Readings from Last 3 Encounters:  03/01/16 120/80  09/15/15 114/82  08/12/15 126/74   Wt Readings from Last 3 Encounters:  03/01/16 178 lb 12.8 oz (81.1 kg)  09/15/15 175 lb 6 oz (79.5 kg)  08/12/15 170 lb 9.6  oz (77.4 kg)    Physical Exam  Constitutional: She appears well-developed and well-nourished.  Eyes: Conjunctivae are normal.  Cardiovascular: Normal rate, regular rhythm, normal heart sounds and normal pulses.   Pulmonary/Chest: Effort normal and breath sounds normal. She has no wheezes. She has no rhonchi. She has no rales.  Neurological: She is alert.  Skin: Skin is warm and dry.  Psychiatric: She has a normal mood and affect. Her speech is normal and behavior is normal. Thought content normal.  Vitals reviewed.      Assessment & Plan:   Problem List Items Addressed This Visit      Other   Generalized anxiety disorder - Primary    Anxiety is worsening.  Effexor not working well. Tried Zoloft  in past and didn't like. Concern for weight gain and sexual side effects; however more concerned with increasing anxiety. We decided to trial an SSRI as I explained this class is first line for GAD and depression.  Education and discussion around taper of SNRI to SSRI. Advised patient to be very cautious and let me know if she experiences any withdrawal symptoms, or palpitations. Patient is not actively trying to conceive however is also not using any contraceptive at this time. We discussed the risks and benefits of SSRIs in a potential pregnancy which is largely unknown, advised caution. Based on PubMed and UptoDate reading, it appears SSRI's have increasingly burgeoning evidence and generally thought to be safe in pregnancy. There are conflicting studies which show an association with SSRI's and spontaneous abortion and congential malformation ( notably paroxetine). If patient becomes pregnant, we would need to reassess decision to be on medication as they is conflicting evidence that suggests preterm birth. We are starting a  low-dose citalopram and patient and I jointly agreed that not treating anxiety may to worse outcomes for her QOL. Patient politely declined pregnancy test today and desires to start Celexa. F/u via MyChart or in person in couple of weeks and especially during taper to ensure tolerating.      Relevant Medications   citalopram (CELEXA) 10 MG tablet   venlafaxine XR (EFFEXOR-XR) 75 MG 24 hr capsule   venlafaxine XR (EFFEXOR-XR) 37.5 MG 24 hr capsule    Other Visit Diagnoses   None.      I have discontinued Stacy Alexander's amoxicillin-clavulanate, benzonatate, lidocaine, and azithromycin. I have also changed her venlafaxine XR and venlafaxine XR. Additionally, I am having her start on citalopram. Lastly, I am having her maintain her Vitamin D (Ergocalciferol), b complex vitamins, Iron-Vit C-Vit B12-Folic Acid (IRON 100 PLUS PO), Melaton-Thean-Cham-PassF-LBalm (MELATONIN + L-THEANINE  PO), Vitamin D3, multivitamin with minerals, saccharomyces boulardii, Fish Oil, and fluticasone.   Meds ordered this encounter  Medications  . citalopram (CELEXA) 10 MG tablet    Sig: Take 1 tablet (10 mg total) by mouth daily.    Dispense:  90 tablet    Refill:  0    Order Specific Question:   Supervising Provider    Answer:   Duncan Dull L [[id number]]  . venlafaxine XR (EFFEXOR-XR) 75 MG 24 hr capsule    Sig: Take 1 capsule (75 mg total) by mouth daily with breakfast.    Dispense:  30 capsule    Refill:  0    Order Specific Question:   Supervising Provider    Answer:   Duncan Dull L [[id number]]  . venlafaxine XR (EFFEXOR-XR) 37.5 MG 24 hr capsule    Sig: Take 1 capsule (37.5 mg total) by  mouth daily with breakfast.    Dispense:  30 capsule    Refill:  0    Order Specific Question:   Supervising Provider    Answer:   Sherlene Shams [[id number]]    Return precautions given.   Risks, benefits, and alternatives of the medications and treatment plan prescribed today were discussed, and patient expressed understanding.   Education regarding symptom management and diagnosis given to patient on AVS.  Continue to follow with TULLO, Mar Daring, MD for routine health maintenance.   Stacy Mormon and I agreed with plan.   Rennie Plowman, FNP  Total of 25 minutes spent with patient, greater than 50% of which was spent in discussion of  On anxiety and antidepressant treatment with potential pregnancy.

## 2016-03-01 NOTE — Patient Instructions (Addendum)
Take effexor 75mg  every day for 2 weeks.  Take effexor 37.5 mg every day for 1 week. Start Celexa 10mg  the following week.   Let me know how you are and I'm thinking of you.

## 2016-03-01 NOTE — Assessment & Plan Note (Addendum)
Anxiety is worsening.  Effexor not working well. Tried Zoloft in past and didn't like. Concern for weight gain and sexual side effects; however more concerned with increasing anxiety. We decided to trial an SSRI as I explained this class is first line for GAD and depression.  Education and discussion around taper of SNRI to SSRI. Advised patient to be very cautious and let me know if she experiences any withdrawal symptoms, or palpitations. Patient is not actively trying to conceive however is also not using any contraceptive at this time. We discussed the risks and benefits of SSRIs in a potential pregnancy which is largely unknown, advised caution. Based on PubMed and UptoDate reading, it appears SSRI's have increasingly burgeoning evidence and generally thought to be safe in pregnancy. There are conflicting studies which show an association with SSRI's and spontaneous abortion and congential malformation ( notably paroxetine). If patient becomes pregnant, we would need to reassess decision to be on medication as they is conflicting evidence that suggests preterm birth. We are starting a  low-dose citalopram and patient and I jointly agreed that not treating anxiety may to worse outcomes for her QOL. Patient politely declined pregnancy test today and desires to start Celexa. F/u via MyChart or in person in couple of weeks and especially during taper to ensure tolerating.

## 2016-03-01 NOTE — Progress Notes (Signed)
Pre visit review using our clinic review tool, if applicable. No additional management support is needed unless otherwise documented below in the visit note. 

## 2016-03-11 ENCOUNTER — Telehealth: Payer: Self-pay | Admitting: Family

## 2016-03-11 DIAGNOSIS — F32A Depression, unspecified: Secondary | ICD-10-CM

## 2016-03-11 DIAGNOSIS — F329 Major depressive disorder, single episode, unspecified: Secondary | ICD-10-CM

## 2016-03-11 MED ORDER — SERTRALINE HCL 50 MG PO TABS: 50.0000 mg | ORAL_TABLET | Freq: Every day | ORAL | 0 refills | Status: DC

## 2016-03-11 NOTE — Telephone Encounter (Signed)
Left message for patient to return call back.  

## 2016-03-11 NOTE — Telephone Encounter (Signed)
Patient has been informed.

## 2016-03-11 NOTE — Telephone Encounter (Signed)
Please call patient-  I have sent in rx for zoloft and discontinued Celexa.   Plan is to titrate off the Effexor which she is on and start Zoloft; she is already decreased Effexor to 37.5mg  dose.   she was supposed to complete one week of 37.5mg  Effexor and then STOP medication.   Please ask her how many days left she has of Effexor to complete the one week titrating off?   I would like for her to start the zoloft when only a couple of days left on Effexor so there is not too much overlap.   Please make f/u appt in 8 weeks as it takes about 8 weeks to see full effect of medication.

## 2016-03-25 ENCOUNTER — Ambulatory Visit: Payer: BC Managed Care – PPO | Admitting: Family Medicine

## 2016-03-25 DIAGNOSIS — Z0289 Encounter for other administrative examinations: Secondary | ICD-10-CM

## 2016-04-12 ENCOUNTER — Telehealth: Payer: Self-pay | Admitting: Family

## 2016-04-12 DIAGNOSIS — F329 Major depressive disorder, single episode, unspecified: Secondary | ICD-10-CM

## 2016-04-12 DIAGNOSIS — F32A Depression, unspecified: Secondary | ICD-10-CM

## 2016-04-12 DIAGNOSIS — F419 Anxiety disorder, unspecified: Principal | ICD-10-CM

## 2016-04-12 NOTE — Telephone Encounter (Signed)
Order placed

## 2016-04-18 ENCOUNTER — Other Ambulatory Visit: Payer: Self-pay | Admitting: Family

## 2016-04-18 NOTE — Progress Notes (Signed)
Spoke with patient via my personal phone. She has stopped effexor. Only on zoloft.  Appt made for next week to discuss adjunt with wellbutrin to zoloft.

## 2016-04-27 ENCOUNTER — Ambulatory Visit (INDEPENDENT_AMBULATORY_CARE_PROVIDER_SITE_OTHER): Payer: BC Managed Care – PPO | Admitting: Family

## 2016-04-27 ENCOUNTER — Encounter: Payer: Self-pay | Admitting: Family

## 2016-04-27 VITALS — BP 135/85 | HR 81 | Temp 98.2°F | Ht 64.0 in

## 2016-04-27 DIAGNOSIS — F411 Generalized anxiety disorder: Secondary | ICD-10-CM

## 2016-04-27 MED ORDER — ESCITALOPRAM OXALATE 10 MG PO TABS: 10.0000 mg | ORAL_TABLET | Freq: Every day | ORAL | 0 refills | Status: DC

## 2016-04-27 NOTE — Patient Instructions (Signed)
May stop zoloft today and start lexapro today since you had been on low dose of zoloft.   Hope this is the perfect one for you :)

## 2016-04-27 NOTE — Assessment & Plan Note (Addendum)
Trial of lexapro after weighing side effect profile with other antidepressants. Since on low dose of zoloft, advised to stop today and go ahead and start lexapro at similar dose. Will let me know if she has any withdrawal symptoms. F/u 6 weeks.

## 2016-04-27 NOTE — Progress Notes (Deleted)
Subjective:    Patient ID: Stacy Alexander, female    DOB: 02/10/1976, 40 y.o.   MRN: [id number]  CC: Stacy Alexander is a 40 y.o. female who presents today for follow up.   HPI: GAD- Zoloft is making her groggy in the morning and doesn't stay with her throughout the day when she needs the medication the most. Sleeping well with melatonin. Would like to try a different medication. Seeing a counselor now which is very helpful. Plans to call back and back an appt with psychiatrist ( referral placed). Long discussion with husband and no immediate plans to have another child; will re address in 6 months however husband want her to focus on her 'health and happiness' now. No thoughts of hurting herself or anyone else.  Has tried effexor and celexa in the past which didn't work well.    HISTORY:  Past Medical History:  Diagnosis Date  . Allergy   . Arthritis   . Gallstones 2014   Past Surgical History:  Procedure Laterality Date  . CESAREAN SECTION  2010,2013  . CHOLECYSTECTOMY  07/10/14   Family History  Problem Relation Age of Onset  . Cancer Maternal Uncle     colon  . Hypertension Mother   . Hyperlipidemia Mother   . Hypertension Father   . Hyperlipidemia Father     Allergies: Patient has no known allergies. Current Outpatient Prescriptions on File Prior to Visit  Medication Sig Dispense Refill  . b complex vitamins tablet Take 1 tablet by mouth daily.    . Cholecalciferol (VITAMIN D3) 1000 UNITS CAPS Take 1,000 Units by mouth daily.     . fluticasone (FLONASE) 50 MCG/ACT nasal spray Place 2 sprays into both nostrils daily. 16 g 6  . Iron-Vit C-Vit B12-Folic Acid (IRON 100 PLUS PO) Take by mouth.    . Melaton-Thean-Cham-PassF-LBalm (MELATONIN + L-THEANINE PO) Take by mouth.    . Multiple Vitamins-Minerals (MULTIVITAMIN WITH MINERALS) tablet Take 1 tablet by mouth daily.    . Omega-3 Fatty Acids (FISH OIL) 1000 MG CAPS Take 1 capsule by mouth daily.    Marland Kitchen. saccharomyces  boulardii (FLORASTOR) 250 MG capsule Take 250 mg by mouth 2 (two) times daily. Reported on 07/23/2015    . Vitamin D, Ergocalciferol, (DRISDOL) 50000 UNITS CAPS Take 50,000 Units by mouth. Reported on 07/23/2015     No current facility-administered medications on file prior to visit.     Social History  Substance Use Topics  . Smoking status: Never Smoker  . Smokeless tobacco: Never Used  . Alcohol use No    Review of Systems  Constitutional: Negative for chills and fever.  Respiratory: Negative for cough.   Cardiovascular: Negative for chest pain and palpitations.  Gastrointestinal: Negative for nausea and vomiting.  Psychiatric/Behavioral: Negative for sleep disturbance and suicidal ideas. The patient is nervous/anxious.       Objective:    BP 135/85   Pulse 81   Temp 98.2 F (36.8 C) (Oral)   Ht 5\' 4"  (1.626 m)   SpO2 98%  BP Readings from Last 3 Encounters:  04/27/16 135/85  03/01/16 120/80  09/15/15 114/82   Wt Readings from Last 3 Encounters:  03/01/16 178 lb 12.8 oz (81.1 kg)  09/15/15 175 lb 6 oz (79.5 kg)  08/12/15 170 lb 9.6 oz (77.4 kg)    Physical Exam  Constitutional: She appears well-developed and well-nourished.  Eyes: Conjunctivae are normal.  Cardiovascular: Normal rate, regular rhythm, normal heart sounds and  normal pulses.   Pulmonary/Chest: Effort normal and breath sounds normal. She has no wheezes. She has no rhonchi. She has no rales.  Neurological: She is alert.  Skin: Skin is warm and dry.  Psychiatric: She has a normal mood and affect. Her speech is normal and behavior is normal. Thought content normal.  Vitals reviewed.      Assessment & Plan:   Problem List Items Addressed This Visit      Other   Generalized anxiety disorder - Primary    Trial of lexapro after weighing side effect profile with other antidepressants. Since on low dose of zoloft, advised to stop today and go ahead and start lexapro at similar dose. Will let me know if  she has any withdrawal symptoms. F/u 6 weeks.       Relevant Medications   escitalopram (LEXAPRO) 10 MG tablet       I have discontinued Ms. Diebel's sertraline. I am also having her start on escitalopram. Additionally, I am having her maintain her Vitamin D (Ergocalciferol), b complex vitamins, Iron-Vit C-Vit B12-Folic Acid (IRON 100 PLUS PO), Melaton-Thean-Cham-PassF-LBalm (MELATONIN + L-THEANINE PO), Vitamin D3, multivitamin with minerals, saccharomyces boulardii, Fish Oil, and fluticasone.   Meds ordered this encounter  Medications  . escitalopram (LEXAPRO) 10 MG tablet    Sig: Take 1 tablet (10 mg total) by mouth daily.    Dispense:  90 tablet    Refill:  0    Order Specific Question:   Supervising Provider    Answer:   Sherlene ShamsULLO, TERESA L [[id number]]    Return precautions given.   Risks, benefits, and alternatives of the medications and treatment plan prescribed today were discussed, and patient expressed understanding.   Education regarding symptom management and diagnosis given to patient on AVS.  Continue to follow with TULLO, Mar DaringERESA L, MD for routine health maintenance.   Stacy Alexander and I agreed with plan.   Rennie PlowmanMargaret Arnett, FNP

## 2016-04-27 NOTE — Progress Notes (Signed)
Pre visit review using our clinic review tool, if applicable. No additional management support is needed unless otherwise documented below in the visit note. 

## 2016-04-27 NOTE — Progress Notes (Signed)
Subjective:    Patient ID: Stacy MormonFrances R Hodapp, female    DOB: 04/21/1976, 40 y.o.   MRN: [id number]  CC: Stacy Alexander is a 40 y.o. female who presents today for an acute visit.    HPI:  HPI: GAD- Zoloft is making her groggy in the morning and doesn't stay with her throughout the day when she needs the medication the most. Sleeping well with melatonin. Would like to try a different medication. Seeing a counselor now which is very helpful. Plans to call back and back an appt with psychiatrist ( referral placed). Long discussion with husband and no immediate plans to have another child; will re address in 6 months however husband want her to focus on her 'health and happiness' now. No thoughts of hurting herself or anyone else.  Has tried effexor and celexa in the past which didn't work well.    HISTORY:  Past Medical History:  Diagnosis Date  . Allergy   . Arthritis   . Gallstones 2014   Past Surgical History:  Procedure Laterality Date  . CESAREAN SECTION  2010,2013  . CHOLECYSTECTOMY  07/10/14   Family History  Problem Relation Age of Onset  . Cancer Maternal Uncle     colon  . Hypertension Mother   . Hyperlipidemia Mother   . Hypertension Father   . Hyperlipidemia Father     Allergies: Patient has no known allergies. Current Outpatient Prescriptions on File Prior to Visit  Medication Sig Dispense Refill  . b complex vitamins tablet Take 1 tablet by mouth daily.    . Cholecalciferol (VITAMIN D3) 1000 UNITS CAPS Take 1,000 Units by mouth daily.     . fluticasone (FLONASE) 50 MCG/ACT nasal spray Place 2 sprays into both nostrils daily. 16 g 6  . Iron-Vit C-Vit B12-Folic Acid (IRON 100 PLUS PO) Take by mouth.    . Melaton-Thean-Cham-PassF-LBalm (MELATONIN + L-THEANINE PO) Take by mouth.    . Multiple Vitamins-Minerals (MULTIVITAMIN WITH MINERALS) tablet Take 1 tablet by mouth daily.    . Omega-3 Fatty Acids (FISH OIL) 1000 MG CAPS Take 1 capsule by mouth daily.    Marland Kitchen.  saccharomyces boulardii (FLORASTOR) 250 MG capsule Take 250 mg by mouth 2 (two) times daily. Reported on 07/23/2015    . Vitamin D, Ergocalciferol, (DRISDOL) 50000 UNITS CAPS Take 50,000 Units by mouth. Reported on 07/23/2015     No current facility-administered medications on file prior to visit.     Social History  Substance Use Topics  . Smoking status: Never Smoker  . Smokeless tobacco: Never Used  . Alcohol use No    Review of Systems  Constitutional: Negative for chills and fever.  Respiratory: Negative for cough.   Cardiovascular: Negative for chest pain and palpitations.  Gastrointestinal: Negative for nausea and vomiting.  Psychiatric/Behavioral: Negative for sleep disturbance and suicidal ideas. The patient is nervous/anxious.       Objective:    BP 135/85   Pulse 81   Temp 98.2 F (36.8 C) (Oral)   Ht 5\' 4"  (1.626 m)   SpO2 98%    Physical Exam  Constitutional: She appears well-developed and well-nourished.  Eyes: Conjunctivae are normal.  Cardiovascular: Normal rate, regular rhythm, normal heart sounds and normal pulses.   Pulmonary/Chest: Effort normal and breath sounds normal. She has no wheezes. She has no rhonchi. She has no rales.  Neurological: She is alert.  Skin: Skin is warm and dry.  Psychiatric: She has a normal mood and affect.  Her speech is normal and behavior is normal. Thought content normal.  Vitals reviewed.      Assessment & Plan:   Problem List Items Addressed This Visit      Other   Generalized anxiety disorder - Primary    Trial of lexapro after weighing side effect profile with other antidepressants. Since on low dose of zoloft, advised to stop today and go ahead and start lexapro at similar dose. Will let me know if she has any withdrawal symptoms. F/u 6 weeks.       Relevant Medications   escitalopram (LEXAPRO) 10 MG tablet         I have discontinued Ms. Noblett's sertraline. I am also having her start on escitalopram.  Additionally, I am having her maintain her Vitamin D (Ergocalciferol), b complex vitamins, Iron-Vit C-Vit B12-Folic Acid (IRON 100 PLUS PO), Melaton-Thean-Cham-PassF-LBalm (MELATONIN + L-THEANINE PO), Vitamin D3, multivitamin with minerals, saccharomyces boulardii, Fish Oil, and fluticasone.   Meds ordered this encounter  Medications  . escitalopram (LEXAPRO) 10 MG tablet    Sig: Take 1 tablet (10 mg total) by mouth daily.    Dispense:  90 tablet    Refill:  0    Order Specific Question:   Supervising Provider    Answer:   Sherlene ShamsULLO, TERESA L [[id number]]    Return precautions given.   Risks, benefits, and alternatives of the medications and treatment plan prescribed today were discussed, and patient expressed understanding.   Education regarding symptom management and diagnosis given to patient on AVS.  Continue to follow with TULLO, Mar DaringERESA L, MD for routine health maintenance.   Stacy MormonFrances R Wimer and I agreed with plan.   Rennie PlowmanMargaret Baelynn Schmuhl, FNP

## 2016-05-04 ENCOUNTER — Ambulatory Visit: Payer: BC Managed Care – PPO | Admitting: Family

## 2016-07-30 ENCOUNTER — Other Ambulatory Visit: Payer: Self-pay | Admitting: Family

## 2016-07-30 DIAGNOSIS — F411 Generalized anxiety disorder: Secondary | ICD-10-CM

## 2016-08-01 ENCOUNTER — Telehealth: Payer: Self-pay | Admitting: Internal Medicine

## 2016-08-01 ENCOUNTER — Other Ambulatory Visit: Payer: Self-pay | Admitting: Family

## 2016-08-01 DIAGNOSIS — F411 Generalized anxiety disorder: Secondary | ICD-10-CM

## 2016-08-01 MED ORDER — ESCITALOPRAM OXALATE 10 MG PO TABS: 10.0000 mg | ORAL_TABLET | Freq: Every day | ORAL | 0 refills | Status: DC

## 2016-08-01 MED ORDER — ESCITALOPRAM OXALATE 10 MG PO TABS: 10.0000 mg | ORAL_TABLET | Freq: Every day | ORAL | 1 refills | Status: DC

## 2016-08-01 NOTE — Progress Notes (Signed)
Patient sent me a message since I prescribed.   Refilled for her.   FYI

## 2016-08-01 NOTE — Telephone Encounter (Signed)
Please notify patient that the prescription  For lexapro is being  Refilled as a courtesy for 30 days only because it has been 12 months since last visit, and Claris CheMargaret saw her in December and asked her to follow up in 6 weeks  .  OFFICE VISIT NEEDED prior to any more refills

## 2016-08-02 NOTE — Telephone Encounter (Signed)
LMTCB

## 2016-08-04 NOTE — Telephone Encounter (Signed)
Left message to call.

## 2016-10-10 ENCOUNTER — Encounter: Payer: Self-pay | Admitting: Family

## 2016-10-10 ENCOUNTER — Ambulatory Visit (INDEPENDENT_AMBULATORY_CARE_PROVIDER_SITE_OTHER): Payer: BC Managed Care – PPO | Admitting: Family

## 2016-10-10 VITALS — BP 126/80 | HR 73 | Temp 98.0°F | Ht 64.0 in | Wt 176.0 lb

## 2016-10-10 DIAGNOSIS — J0100 Acute maxillary sinusitis, unspecified: Secondary | ICD-10-CM

## 2016-10-10 MED ORDER — BENZONATATE 100 MG PO CAPS: 100.0000 mg | ORAL_CAPSULE | Freq: Two times a day (BID) | ORAL | 0 refills | Status: AC | PRN

## 2016-10-10 MED ORDER — AMOXICILLIN 500 MG PO CAPS: 500.0000 mg | ORAL_CAPSULE | Freq: Two times a day (BID) | ORAL | 0 refills | Status: AC

## 2016-10-10 NOTE — Progress Notes (Signed)
Subjective:    Patient ID: Stacy MormonFrances R Torok, female    DOB: 11/13/1975, 41 y.o.   MRN: [id number]  CC: Stacy MormonFrances R Zick is a 41 y.o. female who presents today for an acute visit.    HPI: CC: nasal congestion, cough X one week, worsening. Endorses sore throat, swollen throat nodes.   Has tried tessalon, zyrtec D and using Netti pot with some relief.   NO fever, CP, sob.   Has young children- youngest has been sick with URI.     HISTORY:  Past Medical History:  Diagnosis Date  . Allergy   . Arthritis   . Gallstones 2014   Past Surgical History:  Procedure Laterality Date  . CESAREAN SECTION  2010,2013  . CHOLECYSTECTOMY  07/10/14   Family History  Problem Relation Age of Onset  . Hypertension Mother   . Hyperlipidemia Mother   . Hypertension Father   . Hyperlipidemia Father   . Cancer Maternal Uncle        colon    Allergies: Patient has no known allergies. Current Outpatient Prescriptions on File Prior to Visit  Medication Sig Dispense Refill  . b complex vitamins tablet Take 1 tablet by mouth daily.    . Cholecalciferol (VITAMIN D3) 1000 UNITS CAPS Take 1,000 Units by mouth daily.     Marland Kitchen. escitalopram (LEXAPRO) 10 MG tablet Take 1 tablet (10 mg total) by mouth daily. 30 tablet 0  . fluticasone (FLONASE) 50 MCG/ACT nasal spray Place 2 sprays into both nostrils daily. 16 g 6  . Iron-Vit C-Vit B12-Folic Acid (IRON 100 PLUS PO) Take by mouth.    . Melaton-Thean-Cham-PassF-LBalm (MELATONIN + L-THEANINE PO) Take by mouth.    . Multiple Vitamins-Minerals (MULTIVITAMIN WITH MINERALS) tablet Take 1 tablet by mouth daily.    . Omega-3 Fatty Acids (FISH OIL) 1000 MG CAPS Take 1 capsule by mouth daily.    Marland Kitchen. saccharomyces boulardii (FLORASTOR) 250 MG capsule Take 250 mg by mouth 2 (two) times daily. Reported on 07/23/2015    . Vitamin D, Ergocalciferol, (DRISDOL) 50000 UNITS CAPS Take 50,000 Units by mouth. Reported on 07/23/2015     No current facility-administered medications on  file prior to visit.     Social History  Substance Use Topics  . Smoking status: Never Smoker  . Smokeless tobacco: Never Used  . Alcohol use No    Review of Systems  Constitutional: Negative for chills and fever.  HENT: Positive for congestion, sinus pressure and sore throat. Negative for ear pain.   Respiratory: Positive for cough. Negative for shortness of breath and wheezing.   Cardiovascular: Negative for chest pain and palpitations.  Gastrointestinal: Negative for nausea and vomiting.      Objective:    BP 126/80   Pulse 73   Temp 98 F (36.7 C) (Oral)   Ht 5\' 4"  (1.626 m)   Wt 176 lb (79.8 kg)   SpO2 98%   BMI 30.21 kg/m    Physical Exam  Constitutional: She appears well-developed and well-nourished.  HENT:  Head: Normocephalic and atraumatic.  Right Ear: Hearing, external ear and ear canal normal. No drainage, swelling or tenderness. No foreign bodies. Tympanic membrane is bulging. Tympanic membrane is not erythematous. No middle ear effusion. No decreased hearing is noted.  Left Ear: Hearing, external ear and ear canal normal. No drainage, swelling or tenderness. No foreign bodies. Tympanic membrane is bulging. Tympanic membrane is not erythematous.  No middle ear effusion. No decreased hearing is noted.  Nose: Nose normal. No rhinorrhea. Right sinus exhibits no maxillary sinus tenderness and no frontal sinus tenderness. Left sinus exhibits no maxillary sinus tenderness and no frontal sinus tenderness.  Mouth/Throat: Uvula is midline, oropharynx is clear and moist and mucous membranes are normal. No oropharyngeal exudate, posterior oropharyngeal edema, posterior oropharyngeal erythema or tonsillar abscesses.  Eyes: Conjunctivae are normal.  Cardiovascular: Regular rhythm, normal heart sounds and normal pulses.   Pulmonary/Chest: Effort normal and breath sounds normal. She has no wheezes. She has no rhonchi. She has no rales.  Lymphadenopathy:       Head (right  side): No submental, no submandibular, no tonsillar, no preauricular, no posterior auricular and no occipital adenopathy present.       Head (left side): No submental, no submandibular, no tonsillar, no preauricular, no posterior auricular and no occipital adenopathy present.    She has no cervical adenopathy.  Neurological: She is alert.  Skin: Skin is warm and dry.  Psychiatric: She has a normal mood and affect. Her speech is normal and behavior is normal. Thought content normal.  Vitals reviewed.      Assessment & Plan:   1. Acute non-recurrent maxillary sinusitis Afebrile and patient is well-appearing. Based on duration of symptoms, patient and I jointly decided to go ahead and start antibiotic therapy. Encourage probiotics. Return precautions given. - amoxicillin (AMOXIL) 500 MG capsule; Take 1 capsule (500 mg total) by mouth 2 (two) times daily.  Dispense: 14 capsule; Refill: 0 - benzonatate (TESSALON) 100 MG capsule; Take 1 capsule (100 mg total) by mouth 2 (two) times daily as needed for cough.  Dispense: 20 capsule; Refill: 0    I am having Ms. Bernerd Pho start on amoxicillin and benzonatate. I am also having her maintain her Vitamin D (Ergocalciferol), b complex vitamins, Iron-Vit C-Vit B12-Folic Acid (IRON 100 PLUS PO), Melaton-Thean-Cham-PassF-LBalm (MELATONIN + L-THEANINE PO), Vitamin D3, multivitamin with minerals, saccharomyces boulardii, Fish Oil, fluticasone, and escitalopram.   Meds ordered this encounter  Medications  . amoxicillin (AMOXIL) 500 MG capsule    Sig: Take 1 capsule (500 mg total) by mouth 2 (two) times daily.    Dispense:  14 capsule    Refill:  0    Order Specific Question:   Supervising Provider    Answer:   Duncan Dull L [[id number]]  . benzonatate (TESSALON) 100 MG capsule    Sig: Take 1 capsule (100 mg total) by mouth 2 (two) times daily as needed for cough.    Dispense:  20 capsule    Refill:  0    Order Specific Question:   Supervising Provider     Answer:   Sherlene Shams [[id number]]    Return precautions given.   Risks, benefits, and alternatives of the medications and treatment plan prescribed today were discussed, and patient expressed understanding.   Education regarding symptom management and diagnosis given to patient on AVS.  Continue to follow with Sherlene Shams, MD for routine health maintenance.   Stacy Alexander and I agreed with plan.   Rennie Plowman, FNP

## 2016-10-10 NOTE — Patient Instructions (Addendum)
Pleasure seeing you, always!  Probiotics as discussed  Let me know if not better .

## 2016-10-10 NOTE — Progress Notes (Signed)
Pre visit review using our clinic review tool, if applicable. No additional management support is needed unless otherwise documented below in the visit note. 

## 2017-01-27 ENCOUNTER — Other Ambulatory Visit: Payer: Self-pay | Admitting: Internal Medicine

## 2017-01-27 NOTE — Telephone Encounter (Signed)
I am Refilling  for 90 days only because she has seen margaret several times this year.   PLEASE SCHEDULE HER AN OFFICE VISIT  DURING THAT TIME [facility]

## 2017-01-27 NOTE — Telephone Encounter (Signed)
NO OV with PCP since 3/17 ok to fill?

## 2017-01-30 NOTE — Telephone Encounter (Signed)
Advised patient since she is currently taking Lexapro was just going to stop one and start the other advised patient to wait until speaking with PCP , and scheduled patient a Follow up for 02/10/17, Patient staed she would rather talk with PCP first.

## 2017-02-10 ENCOUNTER — Ambulatory Visit: Payer: BC Managed Care – PPO | Admitting: Internal Medicine

## 2017-05-01 ENCOUNTER — Other Ambulatory Visit: Payer: Self-pay | Admitting: Internal Medicine

## 2017-05-01 NOTE — Telephone Encounter (Signed)
Please see what she is taking currently. Is she taking lexapro or effexor? Also she needs to have a visit scheduled with Dr Darrick Huntsmanullo to receive a refill. There are multiple refill request notes stating she needs to be seen. She has not seen her in over a year. Thanks.

## 2017-05-01 NOTE — Telephone Encounter (Signed)
Refilled: 01/27/2017 Last OV: 10/10/2016 with Arnett                07/23/2015 with Dr. Darrick Huntsmanullo Next OV: not scheduled

## 2017-05-01 NOTE — Telephone Encounter (Signed)
Spoke with pt and she stated that she has been seeing Rennie PlowmanMargaret Arnett and plans to keep seeing her. Pt also stated that she is no longer taking the lexapro. Pt saw Claris CheMargaret last on 10/10/2016. Medication has been refilled.
